# Patient Record
Sex: Female | Born: 1965 | Race: White | Hispanic: No | Marital: Married | Smoking: Never smoker
Health system: Southern US, Community
[De-identification: ages and names within clinical notes are randomized; demographics above are authoritative.]

## PROBLEM LIST (undated history)

## (undated) DIAGNOSIS — D649 Anemia, unspecified: Secondary | ICD-10-CM

## (undated) HISTORY — PX: DILATION AND CURETTAGE OF UTERUS: SHX78

---

## 1999-11-01 ENCOUNTER — Other Ambulatory Visit: Admission: RE | Admit: 1999-11-01 | Discharge: 1999-11-01 | Payer: Self-pay | Admitting: Obstetrics and Gynecology

## 2006-05-03 ENCOUNTER — Ambulatory Visit (HOSPITAL_COMMUNITY): Admission: RE | Admit: 2006-05-03 | Discharge: 2006-05-03 | Payer: Self-pay | Admitting: Obstetrics and Gynecology

## 2011-07-25 ENCOUNTER — Encounter (HOSPITAL_COMMUNITY)
Admission: RE | Admit: 2011-07-25 | Discharge: 2011-07-25 | Disposition: A | Discharge status: Home / Self Care | Payer: 59 | Source: Ambulatory Visit | Attending: Obstetrics and Gynecology | Admitting: Obstetrics and Gynecology

## 2011-07-25 ENCOUNTER — Encounter (HOSPITAL_COMMUNITY): Payer: Self-pay | Admitting: Pharmacist

## 2011-07-25 ENCOUNTER — Other Ambulatory Visit: Payer: Self-pay | Admitting: Obstetrics and Gynecology

## 2011-07-25 ENCOUNTER — Encounter (HOSPITAL_COMMUNITY): Payer: Self-pay

## 2011-07-25 HISTORY — DX: Anemia, unspecified: D64.9

## 2011-07-25 LAB — CBC
HCT: 37.6 % (ref 36.0–46.0)
Hemoglobin: 12.4 g/dL (ref 12.0–15.0)
RBC: 4.21 MIL/uL (ref 3.87–5.11)
WBC: 6.1 10*3/uL (ref 4.0–10.5)

## 2011-07-25 LAB — SURGICAL PCR SCREEN: Staphylococcus aureus: NEGATIVE

## 2011-07-25 NOTE — H&P (Signed)
Theresa Ramos is an 46 y.o. female with fibroids measuring 1.8 to 5.9 cm on U/S . Difficult to see right ovary on U/S. Patient complains of increasingly heavy menses and discomfort with intercourse.  Pertinent Gynecological History: Menses: flow is excessive with use of 6 pads or tampons on heaviest days Bleeding: dysfunctional uterine bleeding Contraception: vasectomy DES exposure: unknown Blood transfusions: none Sexually transmitted diseases: no past history Previous GYN Procedures: none  Last mammogram: normal Date: 2013 Last pap: normal Date: 2013 OB History: G0, P0   Menstrual History: Menarche age: unknown No LMP recorded.    Past Medical History  Diagnosis Date  . Anemia     Past Surgical History  Procedure Date  . Dilation and curettage of uterus     No family history on file.  Social History:  reports that she has never smoked. She does not have any smokeless tobacco history on file. She reports that she drinks alcohol. She reports that she does not use illicit drugs.  Allergies:  Allergies  Allergen Reactions  . Adhesive (Tape) Rash    No prescriptions prior to admission    Review of Systems  Constitutional: Negative for fever.  Respiratory: Negative for shortness of breath.   Cardiovascular: Negative for chest pain.    There were no vitals taken for this visit. Physical Exam  Constitutional: She appears well-developed and well-nourished.  HENT:  Head: Normocephalic and atraumatic.  Neck: Normal range of motion. Neck supple. No thyromegaly present.  Cardiovascular: Normal rate and regular rhythm.   Respiratory: Effort normal and breath sounds normal.  GI: Soft.       Uterus palpable 1 fingerbreadth above pubic symphysis  Genitourinary:       Uterus 14-16 weeks size, fills pelvis Adnexal exam compromised  Neurological: She has normal reflexes.    Results for orders placed during the hospital encounter of 07/25/11 (from the past 24 hour(s))    SURGICAL PCR SCREEN     Status: Normal   Collection Time   07/25/11  3:15 PM      Component Value Range   MRSA, PCR NEGATIVE  NEGATIVE    Staphylococcus aureus NEGATIVE  NEGATIVE   CBC     Status: Abnormal   Collection Time   07/25/11  3:30 PM      Component Value Range   WBC 6.1  4.0 - 10.5 (K/uL)   RBC 4.21  3.87 - 5.11 (MIL/uL)   Hemoglobin 12.4  12.0 - 15.0 (g/dL)   HCT 21.3  08.6 - 57.8 (%)   MCV 89.3  78.0 - 100.0 (fL)   MCH 29.5  26.0 - 34.0 (pg)   MCHC 33.0  30.0 - 36.0 (g/dL)   RDW 46.9  62.9 - 52.8 (%)   Platelets 129 (*) 150 - 400 (K/uL)    No results found.  Assessment/Plan: 46 yo G0P0 with symptomatic uterine fibroids.  Options reviewed.  TAH D/W patient, potential risks reviewed including infection, bleeding/transfusion-HIV/Hep, organ damage, DVT/PE, pneumonia, fistula, dyspareunia, abd/pelvic pain. All questions answered.  Shawndra Clute II,Johnhenry Tippin E 07/25/2011, 5:07 PM

## 2011-07-25 NOTE — Patient Instructions (Addendum)
20 Theresa Ramos  07/25/2011   Your procedure is scheduled on:  08/08/11  Enter through the Main Entrance of [facility] at 6 AM.  Pick up the phone at the desk and dial [phone].   Call this number if you have problems the morning of surgery: [phone]   Remember:   Do not eat food:After Midnight.  Do not drink clear liquids: After Midnight.  Take these medicines the morning of surgery with A SIP OF WATER: NA   Do not wear jewelry, make-up or nail polish.  Do not wear lotions, powders, or perfumes. You may wear deodorant.  Do not shave 48 hours prior to surgery.  Do not bring valuables to the hospital.  Contacts, dentures or bridgework may not be worn into surgery.  Leave suitcase in the car. After surgery it may be brought to your room.  For patients admitted to the hospital, checkout time is 11:00 AM the day of discharge.   Patients discharged the day of surgery will not be allowed to drive home.  Name and phone number of your driver: NA  Special Instructions: CHG Shower Use Special Wash: 1/2 bottle night before surgery and 1/2 bottle morning of surgery.   Please read over the following fact sheets that you were given: MRSA Information

## 2011-08-07 MED ORDER — CEFAZOLIN SODIUM-DEXTROSE 2-3 GM-% IV SOLR
2.0000 g | INTRAVENOUS | Status: AC
  Administered 2011-08-08: 2 g via INTRAVENOUS
  Filled 2011-08-07: qty 50

## 2011-08-08 ENCOUNTER — Inpatient Hospital Stay (HOSPITAL_COMMUNITY)
Admission: RE | Admit: 2011-08-08 | Discharge: 2011-08-10 | DRG: 743 | Disposition: A | Discharge status: Home / Self Care | Payer: 59 | Source: Ambulatory Visit | Attending: Obstetrics and Gynecology | Admitting: Obstetrics and Gynecology

## 2011-08-08 ENCOUNTER — Inpatient Hospital Stay (HOSPITAL_COMMUNITY): Payer: 59 | Admitting: Anesthesiology

## 2011-08-08 ENCOUNTER — Encounter (HOSPITAL_COMMUNITY)
Admission: RE | Disposition: A | Discharge status: Home / Self Care | Payer: Self-pay | Source: Ambulatory Visit | Attending: Obstetrics and Gynecology

## 2011-08-08 ENCOUNTER — Encounter (HOSPITAL_COMMUNITY): Payer: Self-pay

## 2011-08-08 ENCOUNTER — Encounter (HOSPITAL_COMMUNITY): Payer: Self-pay | Admitting: Anesthesiology

## 2011-08-08 DIAGNOSIS — N803 Endometriosis of pelvic peritoneum, unspecified: Secondary | ICD-10-CM | POA: Diagnosis present

## 2011-08-08 DIAGNOSIS — N938 Other specified abnormal uterine and vaginal bleeding: Secondary | ICD-10-CM | POA: Diagnosis present

## 2011-08-08 DIAGNOSIS — N949 Unspecified condition associated with female genital organs and menstrual cycle: Secondary | ICD-10-CM | POA: Diagnosis present

## 2011-08-08 DIAGNOSIS — N840 Polyp of corpus uteri: Secondary | ICD-10-CM | POA: Diagnosis present

## 2011-08-08 DIAGNOSIS — N841 Polyp of cervix uteri: Secondary | ICD-10-CM | POA: Diagnosis present

## 2011-08-08 DIAGNOSIS — D251 Intramural leiomyoma of uterus: Principal | ICD-10-CM | POA: Diagnosis present

## 2011-08-08 HISTORY — PX: ABDOMINAL HYSTERECTOMY: SHX81

## 2011-08-08 LAB — CBC
Hemoglobin: 11.1 g/dL — ABNORMAL LOW (ref 12.0–15.0)
MCHC: 32.6 g/dL (ref 30.0–36.0)
RDW: 13.9 % (ref 11.5–15.5)
WBC: 9.5 10*3/uL (ref 4.0–10.5)

## 2011-08-08 SURGERY — HYSTERECTOMY, ABDOMINAL
Anesthesia: General | Site: Abdomen | Wound class: Clean Contaminated

## 2011-08-08 MED ORDER — KETOROLAC TROMETHAMINE 30 MG/ML IJ SOLN: 15.0000 mg | Freq: Once | INTRAMUSCULAR | Status: DC | PRN

## 2011-08-08 MED ORDER — DIPHENHYDRAMINE HCL 50 MG/ML IJ SOLN: 12.5000 mg | INTRAMUSCULAR | Status: DC | PRN

## 2011-08-08 MED ORDER — ACETAMINOPHEN 325 MG PO TABS: 325.0000 mg | ORAL_TABLET | ORAL | Status: DC | PRN

## 2011-08-08 MED ORDER — ACETAMINOPHEN 10 MG/ML IV SOLN
1000.0000 mg | Freq: Four times a day (QID) | INTRAVENOUS | Status: AC | PRN
  Administered 2011-08-08: 1000 mg via INTRAVENOUS
  Filled 2011-08-08 (×2): qty 100

## 2011-08-08 MED ORDER — ONDANSETRON HCL 4 MG/2ML IJ SOLN
INTRAMUSCULAR | Status: AC
  Filled 2011-08-08: qty 2

## 2011-08-08 MED ORDER — DIPHENHYDRAMINE HCL 50 MG/ML IJ SOLN
INTRAMUSCULAR | Status: DC | PRN
  Administered 2011-08-08: 25 mg via INTRAVENOUS

## 2011-08-08 MED ORDER — NALOXONE HCL 0.4 MG/ML IJ SOLN: 0.4000 mg | INTRAMUSCULAR | Status: DC | PRN

## 2011-08-08 MED ORDER — LACTATED RINGERS IV SOLN
INTRAVENOUS | Status: DC
  Administered 2011-08-08 (×5): via INTRAVENOUS

## 2011-08-08 MED ORDER — MORPHINE SULFATE (PF) 0.5 MG/ML IJ SOLN
INTRAMUSCULAR | Status: DC | PRN
  Administered 2011-08-08: 100 ug via INTRATHECAL

## 2011-08-08 MED ORDER — MEPERIDINE HCL 25 MG/ML IJ SOLN: 6.2500 mg | INTRAMUSCULAR | Status: DC | PRN

## 2011-08-08 MED ORDER — PHENYLEPHRINE 40 MCG/ML (10ML) SYRINGE FOR IV PUSH (FOR BLOOD PRESSURE SUPPORT)
PREFILLED_SYRINGE | INTRAVENOUS | Status: AC
  Filled 2011-08-08: qty 10

## 2011-08-08 MED ORDER — ONDANSETRON HCL 4 MG/2ML IJ SOLN: 4.0000 mg | Freq: Three times a day (TID) | INTRAMUSCULAR | Status: DC | PRN

## 2011-08-08 MED ORDER — ONDANSETRON HCL 4 MG/2ML IJ SOLN
INTRAMUSCULAR | Status: DC | PRN
  Administered 2011-08-08 (×2): 4 mg via INTRAVENOUS

## 2011-08-08 MED ORDER — SODIUM CHLORIDE 0.9 % IJ SOLN: 3.0000 mL | INTRAMUSCULAR | Status: DC | PRN

## 2011-08-08 MED ORDER — PROPOFOL 10 MG/ML IV EMUL
INTRAVENOUS | Status: DC | PRN
  Administered 2011-08-08 (×5): 2 mL via INTRAVENOUS

## 2011-08-08 MED ORDER — FENTANYL CITRATE 0.05 MG/ML IJ SOLN
INTRAMUSCULAR | Status: AC
  Filled 2011-08-08: qty 2

## 2011-08-08 MED ORDER — BUPIVACAINE HCL 0.75 % IJ SOLN
INTRAMUSCULAR | Status: DC | PRN
  Administered 2011-08-08: 2 mL via INTRATHECAL

## 2011-08-08 MED ORDER — PHENYLEPHRINE 40 MCG/ML (10ML) SYRINGE FOR IV PUSH (FOR BLOOD PRESSURE SUPPORT)
PREFILLED_SYRINGE | INTRAVENOUS | Status: AC
  Filled 2011-08-08: qty 5

## 2011-08-08 MED ORDER — ALUM & MAG HYDROXIDE-SIMETH 200-200-20 MG/5ML PO SUSP: 30.0000 mL | ORAL | Status: DC | PRN

## 2011-08-08 MED ORDER — DIPHENHYDRAMINE HCL 25 MG PO CAPS: 25.0000 mg | ORAL_CAPSULE | ORAL | Status: DC | PRN

## 2011-08-08 MED ORDER — DEXAMETHASONE SODIUM PHOSPHATE 10 MG/ML IJ SOLN
INTRAMUSCULAR | Status: DC | PRN
  Administered 2011-08-08: 10 mg via INTRAVENOUS

## 2011-08-08 MED ORDER — DIPHENHYDRAMINE HCL 25 MG PO CAPS
25.0000 mg | ORAL_CAPSULE | ORAL | Status: DC | PRN
  Filled 2011-08-08: qty 1

## 2011-08-08 MED ORDER — DIPHENHYDRAMINE HCL 50 MG/ML IJ SOLN
INTRAMUSCULAR | Status: AC
  Filled 2011-08-08: qty 1

## 2011-08-08 MED ORDER — ONDANSETRON HCL 4 MG PO TABS: 4.0000 mg | ORAL_TABLET | Freq: Four times a day (QID) | ORAL | Status: DC | PRN

## 2011-08-08 MED ORDER — KETOROLAC TROMETHAMINE 60 MG/2ML IM SOLN
60.0000 mg | Freq: Once | INTRAMUSCULAR | Status: AC | PRN
  Administered 2011-08-08: 60 mg via INTRAMUSCULAR

## 2011-08-08 MED ORDER — KETOROLAC TROMETHAMINE 30 MG/ML IJ SOLN: 30.0000 mg | Freq: Four times a day (QID) | INTRAMUSCULAR | Status: DC | PRN

## 2011-08-08 MED ORDER — SCOPOLAMINE 1 MG/3DAYS TD PT72
MEDICATED_PATCH | TRANSDERMAL | Status: AC
  Administered 2011-08-08: 1.5 mg via TRANSDERMAL
  Filled 2011-08-08: qty 1

## 2011-08-08 MED ORDER — NALBUPHINE HCL 10 MG/ML IJ SOLN: 5.0000 mg | INTRAMUSCULAR | Status: DC | PRN

## 2011-08-08 MED ORDER — DEXAMETHASONE SODIUM PHOSPHATE 10 MG/ML IJ SOLN
INTRAMUSCULAR | Status: AC
  Filled 2011-08-08: qty 1

## 2011-08-08 MED ORDER — NALBUPHINE SYRINGE 5 MG/0.5 ML
5.0000 mg | INJECTION | INTRAMUSCULAR | Status: DC | PRN
  Filled 2011-08-08: qty 1

## 2011-08-08 MED ORDER — FENTANYL CITRATE 0.05 MG/ML IJ SOLN: 25.0000 ug | INTRAMUSCULAR | Status: DC | PRN

## 2011-08-08 MED ORDER — KETOROLAC TROMETHAMINE 30 MG/ML IJ SOLN
30.0000 mg | Freq: Four times a day (QID) | INTRAMUSCULAR | Status: AC | PRN
  Administered 2011-08-08 – 2011-08-09 (×2): 30 mg via INTRAVENOUS
  Filled 2011-08-08 (×2): qty 1

## 2011-08-08 MED ORDER — MENTHOL 3 MG MT LOZG: 1.0000 | LOZENGE | OROMUCOSAL | Status: DC | PRN

## 2011-08-08 MED ORDER — MIDAZOLAM HCL 5 MG/5ML IJ SOLN
INTRAMUSCULAR | Status: DC | PRN
  Administered 2011-08-08: .5 mg via INTRAVENOUS
  Administered 2011-08-08: 1.5 mg via INTRAVENOUS

## 2011-08-08 MED ORDER — IBUPROFEN 600 MG PO TABS
600.0000 mg | ORAL_TABLET | Freq: Four times a day (QID) | ORAL | Status: DC | PRN
  Administered 2011-08-09 – 2011-08-10 (×4): 600 mg via ORAL
  Filled 2011-08-08 (×4): qty 1

## 2011-08-08 MED ORDER — METOCLOPRAMIDE HCL 5 MG/ML IJ SOLN
INTRAMUSCULAR | Status: AC
  Filled 2011-08-08: qty 2

## 2011-08-08 MED ORDER — ONDANSETRON HCL 4 MG/2ML IJ SOLN: 4.0000 mg | Freq: Four times a day (QID) | INTRAMUSCULAR | Status: DC | PRN

## 2011-08-08 MED ORDER — METOCLOPRAMIDE HCL 5 MG/ML IJ SOLN
INTRAMUSCULAR | Status: DC | PRN
  Administered 2011-08-08: 10 mg via INTRAVENOUS

## 2011-08-08 MED ORDER — DIPHENHYDRAMINE HCL 50 MG/ML IJ SOLN: 25.0000 mg | INTRAMUSCULAR | Status: DC | PRN

## 2011-08-08 MED ORDER — MIDAZOLAM HCL 2 MG/2ML IJ SOLN
INTRAMUSCULAR | Status: AC
  Filled 2011-08-08: qty 2

## 2011-08-08 MED ORDER — LIDOCAINE HCL (CARDIAC) 20 MG/ML IV SOLN
INTRAVENOUS | Status: AC
  Filled 2011-08-08: qty 5

## 2011-08-08 MED ORDER — KETOROLAC TROMETHAMINE 60 MG/2ML IM SOLN
INTRAMUSCULAR | Status: AC
  Administered 2011-08-08: 60 mg via INTRAMUSCULAR
  Filled 2011-08-08: qty 2

## 2011-08-08 MED ORDER — SODIUM CHLORIDE 0.9 % IV SOLN
1.0000 ug/kg/h | INTRAVENOUS | Status: DC | PRN
  Filled 2011-08-08: qty 2.5

## 2011-08-08 MED ORDER — SCOPOLAMINE 1 MG/3DAYS TD PT72
1.0000 | MEDICATED_PATCH | Freq: Once | TRANSDERMAL | Status: DC
  Administered 2011-08-08: 1.5 mg via TRANSDERMAL

## 2011-08-08 MED ORDER — PROMETHAZINE HCL 25 MG/ML IJ SOLN: 6.2500 mg | INTRAMUSCULAR | Status: DC | PRN

## 2011-08-08 MED ORDER — KETOROLAC TROMETHAMINE 30 MG/ML IJ SOLN: 30.0000 mg | Freq: Four times a day (QID) | INTRAMUSCULAR | Status: AC | PRN

## 2011-08-08 MED ORDER — DOCUSATE SODIUM 100 MG PO CAPS
100.0000 mg | ORAL_CAPSULE | Freq: Two times a day (BID) | ORAL | Status: DC
  Administered 2011-08-08 – 2011-08-10 (×4): 100 mg via ORAL
  Filled 2011-08-08 (×4): qty 1

## 2011-08-08 MED ORDER — ZOLPIDEM TARTRATE 5 MG PO TABS: 5.0000 mg | ORAL_TABLET | Freq: Every evening | ORAL | Status: DC | PRN

## 2011-08-08 MED ORDER — SCOPOLAMINE 1 MG/3DAYS TD PT72: 1.0000 | MEDICATED_PATCH | Freq: Once | TRANSDERMAL | Status: DC

## 2011-08-08 MED ORDER — MIDAZOLAM HCL 2 MG/2ML IJ SOLN: 0.5000 mg | Freq: Once | INTRAMUSCULAR | Status: DC | PRN

## 2011-08-08 MED ORDER — METOCLOPRAMIDE HCL 5 MG/ML IJ SOLN: 10.0000 mg | Freq: Three times a day (TID) | INTRAMUSCULAR | Status: DC | PRN

## 2011-08-08 MED ORDER — FENTANYL CITRATE 0.05 MG/ML IJ SOLN
INTRAMUSCULAR | Status: DC | PRN
  Administered 2011-08-08: 25 ug via INTRATHECAL
  Administered 2011-08-08: 75 ug via INTRAVENOUS
  Administered 2011-08-08: 25 ug via INTRAVENOUS

## 2011-08-08 MED ORDER — PROPOFOL 10 MG/ML IV EMUL
INTRAVENOUS | Status: AC
  Filled 2011-08-08: qty 20

## 2011-08-08 MED ORDER — HYDROMORPHONE HCL PF 1 MG/ML IJ SOLN: 0.2000 mg | INTRAMUSCULAR | Status: DC | PRN

## 2011-08-08 MED ORDER — OXYCODONE-ACETAMINOPHEN 5-325 MG PO TABS
1.0000 | ORAL_TABLET | ORAL | Status: DC | PRN
  Administered 2011-08-08 – 2011-08-09 (×3): 1 via ORAL
  Administered 2011-08-09 (×3): 2 via ORAL
  Administered 2011-08-10: 1 via ORAL
  Administered 2011-08-10 (×2): 2 via ORAL
  Filled 2011-08-08 (×3): qty 1
  Filled 2011-08-08 (×3): qty 2
  Filled 2011-08-08: qty 1
  Filled 2011-08-08 (×2): qty 2

## 2011-08-08 MED ORDER — LACTATED RINGERS IV SOLN
INTRAVENOUS | Status: DC
  Administered 2011-08-08 – 2011-08-09 (×2): via INTRAVENOUS

## 2011-08-08 MED ORDER — PHENYLEPHRINE HCL 10 MG/ML IJ SOLN
INTRAMUSCULAR | Status: DC | PRN
  Administered 2011-08-08: 60 ug via INTRAVENOUS
  Administered 2011-08-08 (×2): 80 ug via INTRAVENOUS
  Administered 2011-08-08: 40 ug via INTRAVENOUS
  Administered 2011-08-08 (×4): 80 ug via INTRAVENOUS
  Administered 2011-08-08: 40 ug via INTRAVENOUS
  Administered 2011-08-08 (×2): 80 ug via INTRAVENOUS

## 2011-08-08 MED ORDER — SODIUM CHLORIDE 0.9 % IV SOLN: 1.0000 ug/kg/h | INTRAVENOUS | Status: DC | PRN

## 2011-08-08 MED ORDER — MORPHINE SULFATE 0.5 MG/ML IJ SOLN
INTRAMUSCULAR | Status: AC
  Filled 2011-08-08: qty 10

## 2011-08-08 SURGICAL SUPPLY — 32 items
CANISTER SUCTION 2500CC (MISCELLANEOUS) ×2 IMPLANT
CLOTH BEACON ORANGE TIMEOUT ST (SAFETY) ×2 IMPLANT
CONT PATH 16OZ SNAP LID 3702 (MISCELLANEOUS) ×2 IMPLANT
DECANTER SPIKE VIAL GLASS SM (MISCELLANEOUS) IMPLANT
DERMABOND ADVANCED (GAUZE/BANDAGES/DRESSINGS) ×1
DERMABOND ADVANCED .7 DNX12 (GAUZE/BANDAGES/DRESSINGS) ×1 IMPLANT
DRSG COVADERM 4X10 (GAUZE/BANDAGES/DRESSINGS) ×2 IMPLANT
ELECT LIGASURE SHORT 9 REUSE (ELECTRODE) ×2 IMPLANT
GLOVE BIO SURGEON STRL SZ8 (GLOVE) ×4 IMPLANT
GOWN PREVENTION PLUS LG XLONG (DISPOSABLE) ×4 IMPLANT
NS IRRIG 1000ML POUR BTL (IV SOLUTION) ×2 IMPLANT
PACK ABDOMINAL GYN (CUSTOM PROCEDURE TRAY) ×2 IMPLANT
PAD ABD 7.5X8 STRL (GAUZE/BANDAGES/DRESSINGS) ×2 IMPLANT
PAD OB MATERNITY 4.3X12.25 (PERSONAL CARE ITEMS) ×2 IMPLANT
PROTECTOR NERVE ULNAR (MISCELLANEOUS) ×2 IMPLANT
SPONGE LAP 18X18 X RAY DECT (DISPOSABLE) ×4 IMPLANT
STAPLER VISISTAT 35W (STAPLE) ×2 IMPLANT
STRIP CLOSURE SKIN 1/2X4 (GAUZE/BANDAGES/DRESSINGS) ×2 IMPLANT
STRIP CLOSURE SKIN 1/4X4 (GAUZE/BANDAGES/DRESSINGS) ×2 IMPLANT
SUT MNCRL 0 MO-4 VIOLET 18 CR (SUTURE) ×3 IMPLANT
SUT MNCRL 0 VIOLET 6X18 (SUTURE) ×1 IMPLANT
SUT MNCRL AB 0 CT1 27 (SUTURE) ×2 IMPLANT
SUT MON AB-0 CT1 36 (SUTURE) ×2 IMPLANT
SUT MONOCRYL 0 6X18 (SUTURE) ×1
SUT MONOCRYL 0 MO 4 18  CR/8 (SUTURE) ×3
SUT PDS AB 0 CTX 60 (SUTURE) ×4 IMPLANT
SUT PLAIN 2 0 XLH (SUTURE) IMPLANT
SYR CONTROL 10ML LL (SYRINGE) IMPLANT
TAPE CLOTH SURG 4X10 WHT LF (GAUZE/BANDAGES/DRESSINGS) ×2 IMPLANT
TOWEL OR 17X24 6PK STRL BLUE (TOWEL DISPOSABLE) ×4 IMPLANT
TRAY FOLEY CATH 14FR (SET/KITS/TRAYS/PACK) ×2 IMPLANT
WATER STERILE IRR 1000ML POUR (IV SOLUTION) IMPLANT

## 2011-08-08 NOTE — OR Nursing (Signed)
Patients abdomen reddened 1 1/2 inches around incision noted when drapes removed

## 2011-08-08 NOTE — Anesthesia Postprocedure Evaluation (Signed)
  Anesthesia Post-op Note  Patient: Theresa Ramos  Procedure(s) Performed: Procedure(s) (LRB): HYSTERECTOMY ABDOMINAL (N/A)  Patient Location: PACU  Anesthesia Type: Spinal  Level of Consciousness: awake, alert  and oriented  Airway and Oxygen Therapy: Patient Spontanous Breathing  Post-op Pain: none  Post-op Assessment: Post-op Vital signs reviewed, Patient's Cardiovascular Status Stable, Respiratory Function Stable, Patent Airway, No signs of Nausea or vomiting, Pain level controlled, No headache and No backache  Post-op Vital Signs: Reviewed and stable  Complications: No apparent anesthesia complications

## 2011-08-08 NOTE — Progress Notes (Signed)
No changes to H&P per patient history Reviewed with patient proceedure

## 2011-08-08 NOTE — Brief Op Note (Signed)
08/08/2011  9:04 AM  PATIENT:  Theresa Ramos  46 y.o. female  PRE-OPERATIVE DIAGNOSIS:  fibroids  POST-OPERATIVE DIAGNOSIS:  fibroids, endometriosis, endocervical polyp  PROCEDURE:  Procedure(s) (LRB):Supracervical HYSTERECTOMY ABDOMINAL (N/A)  SURGEON:  Surgeon(s) and Role:    * Leslie Andrea, MD - Primary  PHYSICIAN ASSISTANT:   ASSISTANTS: Richarda Overlie MD   ANESTHESIA:   spinal  EBL:  Total I/O In: 2000 [I.V.:2000] Out: 950 [Urine:600; Blood:350]  BLOOD ADMINISTERED:none  DRAINS: Urinary Catheter (Foley)   LOCAL MEDICATIONS USED:  NONE  SPECIMEN:  Source of Specimen:  uterus, endocervical polyp  DISPOSITION OF SPECIMEN:  PATHOLOGY  COUNTS:  YES  TOURNIQUET:  * No tourniquets in log *  DICTATION: .Other Dictation: Dictation Number [id number]  PLAN OF CARE: Admit to inpatient   PATIENT DISPOSITION:  PACU - hemodynamically stable.   Delay start of Pharmacological VTE agent (>24hrs) due to surgical blood loss or risk of bleeding: not applicable

## 2011-08-08 NOTE — Anesthesia Procedure Notes (Signed)
Spinal  Patient location during procedure: OR Start time: 08/08/2011 7:32 AM Staffing Anesthesiologist: Brayton Caves R Performed by: anesthesiologist  Preanesthetic Checklist Completed: patient identified, site marked, surgical consent, pre-op evaluation, timeout performed, IV checked, risks and benefits discussed and monitors and equipment checked Spinal Block Patient position: sitting Prep: DuraPrep Patient monitoring: heart rate, cardiac monitor, continuous pulse ox and blood pressure Approach: midline Location: L3-4 Injection technique: single-shot Needle Needle type: Sprotte  Needle gauge: 24 G Needle length: 9 cm Assessment Sensory level: T4 Additional Notes Patient identified.  Risk benefits discussed including failed block, incomplete pain control, headache, nerve damage, paralysis, blood pressure changes, nausea, vomiting, reactions to medication both toxic or allergic, and postpartum back pain.  Patient expressed understanding and wished to proceed.  All questions were answered.  Sterile technique used throughout procedure.  CSF was clear.  No parasthesia or other complications.  Please see nursing notes for vital signs.

## 2011-08-08 NOTE — Op Note (Signed)
Theresa Ramos, ARCHIBALD               ACCOUNT NO.:  [id number]  MEDICAL RECORD NO.:  [id number]  LOCATION:  [facility]                          FACILITY:  [facility]  PHYSICIAN:  Guy Sandifer. Henderson Cloud, M.D. DATE OF BIRTH:  04/10/1965  DATE OF PROCEDURE:  08/08/2011 DATE OF DISCHARGE:                              OPERATIVE REPORT   PREOPERATIVE DIAGNOSIS:  Uterine leiomyomata.  POSTOPERATIVE DIAGNOSES: 1. Uterine leiomyomata. 2. Endometriosis.  PROCEDURE:  Abdominal supracervical hysterectomy.  SPECIMENS: 1. Uterus. 2. Endocervical polyp, both to Pathology.  SURGEON:  Guy Sandifer. Henderson Cloud, MD.  ASSISTANTDuke Salvia. Marcelle Overlie, MD.  ANESTHESIA:  Spinal.  ANESTHESIOLOGIST:  Germaine Pomfret, MD.  ESTIMATED BLOOD LOSS:  350 mL.  INDICATIONS AND CONSENT: This patient is a 46 year old married white female, husband status post vasectomy with increasingly symptomatic uterine leiomyomata with heavy painful menses.  After discussion of options, total abdominal hysterectomy is elected by the patient.  The ovaries will be retained unless one or both if abnormal.  Potential risks and complications were reviewed preoperatively including but not limited to, infection, organ damage, bleeding requiring transfusion of blood products with HIV and hepatitis acquisition, DVT, PE, pneumonia, fistula formation, postoperative pelvic pain, painful intercourse.  All questions have been answered and consent is signed on the chart.  FINDINGS:  The uterus is about 16 weeks in size with multiple leiomyomata.  There was at least one 5-6 cm intramural leiomyoma in the upper posterior fundus.  There is a 5-cm pedunculated leiomyoma from the right mid uterus extending into the broad ligament.  There is a 2-3 cm palpable nodule consistent with endometriosis from the rectum in the posterior cul-de-sac that is adherent to the vagina.  It appears to be transmural on palpation and there is some induration extending on  either side in the rectum as well.  There are some small implants of endometriosis on the vesicouterine peritoneum.  The ovaries appear normal.  PROCEDURE IN DETAIL:  The patient was taken to the operating room where she was identified, spinal anesthetic was placed, and she was placed in a dorsal supine position.  She was prepped abdominally and vaginally. Foley catheter was placed in the bladder and she was draped in a sterile fashion.  Time-out was undertaken.  Skin was entered through a Pfannenstiel incision and dissection was carried out in layers to the peritoneum, which was perforated and extended superiorly and inferiorly. Andrey Spearman retractor was placed.  Bladder blade was placed.  The bowel was packed away and the upper blade was placed as well.  Proximal ligaments were grasped bilaterally with large Kelly clamps.  The left round ligament was taken down with LigaSure handheld bipolar cautery. The anterior leaf of the broad ligament was then taken down sharply, carried down to the vesicouterine peritoneum, and the posterior leaf was bluntly perforated.  Proximal ligaments were taken down in a stepwise fashion and bites with the LigaSure cautery.  The uterine vessels on the left side were then further skeletonized with progressive bites and taken down with the LigaSure as well.  The round ligament on the right side was taken down in a similar fashion as well as the anterior leaf of  the broad ligament.  This allowed the proximal ligaments to be taken down in stepwise fashion, working around the superior aspect of the pedunculated fibroid extending into the broad ligament on that side. This allowed the broad ligaments and the infundibulopelvic ligament and the ovary to easily fall away and move laterally without difficulty. After the uterine vessels were controlled bilaterally using the malleable clamps as a backstop, the uterine fundus was amputated with a scalpel to obtain  better visualization.  This also allowed the pedunculated fibroid on the right side to be taken separately under good visualization.  Again, well clear of adnexal structures.  Bleeders were controlled with the LigaSure.  This then allowed the bladder to be further advanced in the customary fashion.  Straight clamps were used to take down the cardinal ligaments bilaterally to also take down the uterosacral ligaments.  These pedicles were controlled with 0 Monocryl sutures.  At this point, the nodule in the rectum adherent to the posterior cul-de-sac was noted to be high enough that it interferes with completely removing the cervix.  Therefore, the decision was made to perform a supracervical hysterectomy and to leave part of the cervix behind.  The remaining exposed portion of the lower uterine segment and the upper cervix was resected under good visualization with a scalpel. Prior to this, an endocervical polyp was noted.  After the initial amputation of the uterine fundus, which was removed as well.  After the upper part of the cervix had been amputated, the endocervical canal was cauterized and the stump was closed in the anterior posterior fashion with interrupted 0 Monocryl sutures, which achieved good hemostasis. Copious irrigation reveals good hemostasis all around.  The ovaries were plicated to the round ligaments bilaterally with 0 Monocryl.  After again noting good hemostasis, instruments were removed.  Anterior peritoneum was closed in running fashion with 0 Monocryl suture, which was also used to reapproximate the pyramidalis muscle in the midline. The anterior rectus fascia was closed in a running fashion with a 0 looped PDS.  Subcutaneous tissues were reapproximated with interrupted plain suture and the skin was closed with a subcuticular 4-0 Vicryl suture on a Keith needle.  Dermabond was applied.  All counts were correct.  The patient was taken to the recovery room in  stable condition.     Guy Sandifer Henderson Cloud, M.D.     JET/MEDQ  D:  08/08/2011  T:  08/08/2011  Job:  [id number]

## 2011-08-08 NOTE — Anesthesia Preprocedure Evaluation (Addendum)
Anesthesia Evaluation  Patient identified by MRN, date of birth, ID band Patient awake    Reviewed: Allergy & Precautions, H&P , Patient's Chart, lab work & pertinent test results, reviewed documented beta blocker date and time   History of Anesthesia Complications Negative for: history of anesthetic complications  Airway Mallampati: II TM Distance: >3 FB Neck ROM: full    Dental No notable dental hx.    Pulmonary neg pulmonary ROS,  breath sounds clear to auscultation  Pulmonary exam normal       Cardiovascular Exercise Tolerance: Good negative cardio ROS  Rhythm:regular Rate:Normal     Neuro/Psych negative neurological ROS  negative psych ROS   GI/Hepatic negative GI ROS, Neg liver ROS,   Endo/Other  negative endocrine ROS  Renal/GU negative Renal ROS     Musculoskeletal   Abdominal   Peds  Hematology negative hematology ROS (+)   Anesthesia Other Findings   Reproductive/Obstetrics negative OB ROS                           Anesthesia Physical Anesthesia Plan  ASA: I  Anesthesia Plan: Spinal   Post-op Pain Management:    Induction:   Airway Management Planned:   Additional Equipment:   Intra-op Plan:   Post-operative Plan:   Informed Consent: I have reviewed the patients History and Physical, chart, labs and discussed the procedure including the risks, benefits and alternatives for the proposed anesthesia with the patient or authorized representative who has indicated his/her understanding and acceptance.   Dental Advisory Given  Plan Discussed with: CRNA and Surgeon  Anesthesia Plan Comments:       Anesthesia Quick Evaluation

## 2011-08-08 NOTE — Progress Notes (Signed)
Feeling good, ambulated to door, now in chair Took po pain med with good relief, no flatus yet  Blood pressure 100/61, pulse 72, temperature 98.2 F (36.8 C), temperature source Oral, resp. rate 16, height 5\' 3"  (1.6 m), weight 73.483 kg (162 lb), SpO2 95.00%.  Lungs CTA Cor RRR Abd soft, some BS UO clear  A: stable P: per orders

## 2011-08-08 NOTE — Addendum Note (Signed)
Addendum  created 08/08/11 1647 by Algis Greenhouse, CRNA   Modules edited:Notes Section

## 2011-08-08 NOTE — Anesthesia Postprocedure Evaluation (Signed)
  Anesthesia Post-op Note  Patient: Theresa Ramos  Procedure(s) Performed: Procedure(s) (LRB): HYSTERECTOMY ABDOMINAL (N/A)  Patient Location: [facility]  Anesthesia Type: Spinal  Level of Consciousness: awake  Airway and Oxygen Therapy: Patient Spontanous Breathing  Post-op Pain: mild  Post-op Assessment: Post-op Vital signs reviewed  Post-op Vital Signs: Reviewed and stable  Complications: No apparent anesthesia complications

## 2011-08-08 NOTE — Transfer of Care (Signed)
Immediate Anesthesia Transfer of Care Note  Patient: Theresa Ramos  Procedure(s) Performed: Procedure(s) (LRB): HYSTERECTOMY ABDOMINAL (N/A)  Patient Location: PACU  Anesthesia Type: Spinal  Level of Consciousness: awake  Airway & Oxygen Therapy: Patient Spontanous Breathing  Post-op Assessment: Report given to PACU RN  Post vital signs: Reviewed and stable  Complications: No apparent anesthesia complications

## 2011-08-09 ENCOUNTER — Encounter (HOSPITAL_COMMUNITY): Payer: Self-pay | Admitting: Obstetrics and Gynecology

## 2011-08-09 NOTE — Progress Notes (Signed)
UR Chart review completed.  

## 2011-08-09 NOTE — Progress Notes (Signed)
POD #1 Passing flatus, voiding, ambulating No N/V More low abd soreness today-requests abdominal binder  Blood pressure 90/51, pulse 75, temperature 98 F (36.7 C), temperature source Oral, resp. rate 16, height 5\' 3"  (1.6 m), weight 73.483 kg (162 lb), SpO2 100.00%.  Abd soft, good BS, incision OK  Results for orders placed during the hospital encounter of 08/08/11 (from the past 24 hour(s))  CBC     Status: Abnormal   Collection Time   08/08/11  2:40 PM      Component Value Range   WBC 9.5  4.0 - 10.5 (K/uL)   RBC 3.83 (*) 3.87 - 5.11 (MIL/uL)   Hemoglobin 11.1 (*) 12.0 - 15.0 (g/dL)   HCT 16.1 (*) 09.6 - 46.0 (%)   MCV 89.0  78.0 - 100.0 (fL)   MCH 29.0  26.0 - 34.0 (pg)   MCHC 32.6  30.0 - 36.0 (g/dL)   RDW 04.5  40.9 - 81.1 (%)   Platelets 132 (*) 150 - 400 (K/uL)  A: satisfactory P: abdominal binder Probable discharge tomorrow

## 2011-08-10 MED ORDER — IBUPROFEN 600 MG PO TABS: 600.0000 mg | ORAL_TABLET | Freq: Four times a day (QID) | ORAL | Status: AC | PRN

## 2011-08-10 MED ORDER — OXYCODONE-ACETAMINOPHEN 5-325 MG PO TABS: 1.0000 | ORAL_TABLET | Freq: Four times a day (QID) | ORAL | Status: AC | PRN

## 2011-08-10 NOTE — Progress Notes (Signed)
2 Days Post-Op Procedure(s) (LRB): HYSTERECTOMY ABDOMINAL (N/A)  Subjective: Patient reports tolerating PO, + flatus and no problems voiding.    Objective: I have reviewed patient's vital signs and labs.  General: alert, cooperative and no distress Resp: clear to auscultation bilaterally GI: soft, non-tender; bowel sounds normal; no masses,  no organomegaly and incision: clean and dry Ext non tender  Assessment: s/p Procedure(s) (LRB): HYSTERECTOMY ABDOMINAL (N/A): stable, progressing well and tolerating diet  Plan: Discharge home  LOS: 2 days    Theresa Ramos,Theresa Ramos 08/10/2011, 9:04 AM

## 2011-08-12 NOTE — Discharge Summary (Signed)
Physician Discharge Summary  Patient ID: Theresa Ramos MRN: [id number] DOB/AGE: 44-19-1967 44 y.o.  Admit date: 08/08/2011 Discharge date: 08/12/2011  Admission Diagnoses:Uterine Fibroids   Discharge Diagnoses: Uterine Fibroids Active Problems:  * No active hospital problems. *    Discharged Condition: good  Hospital Course: Postoperatively had good resumption of bowel function. She is voiding well, ambulating, passing flatus with good pain control.  Consults: None  Significant Diagnostic Studies: labs:   Treatments: IV hydration, analgesia: acetaminophen w/ codeine and Dilaudid and surgery: Supracervical abdominal hysterectomy  Discharge Exam: Blood pressure 91/52, pulse 82, temperature 98.6 F (37 C), temperature source Oral, resp. rate 17, height 5\' 3"  (1.6 m), weight 73.483 kg (162 lb), SpO2 97.00%. General appearance: alert, cooperative and no distress Resp: clear to auscultation bilaterally GI: soft, non-tender; bowel sounds normal; no masses,  no organomegaly Incision/Wound:healing well  Disposition: 01-Home or Self Care   Medication List  As of 08/12/2011  8:37 AM   STOP taking these medications         ferrous sulfate 325 (65 FE) MG tablet      naproxen sodium 220 MG tablet      OVER THE COUNTER MEDICATION         TAKE these medications         ibuprofen 600 MG tablet   Commonly known as: ADVIL,MOTRIN   Take 1 tablet (600 mg total) by mouth every 6 (six) hours as needed.      oxyCODONE-acetaminophen 5-325 MG per tablet   Commonly known as: PERCOCET   Take 1-2 tablets by mouth every 6 (six) hours as needed (moderate to severe pain (when tolerating fluids)).             Signed: Daouda Lonzo II,[place] E 08/12/2011, 8:37 AM

## 2014-06-08 ENCOUNTER — Other Ambulatory Visit: Payer: Self-pay | Admitting: Obstetrics and Gynecology

## 2014-06-09 ENCOUNTER — Other Ambulatory Visit: Payer: Self-pay | Admitting: Obstetrics and Gynecology

## 2014-06-09 DIAGNOSIS — N63 Unspecified lump in unspecified breast: Secondary | ICD-10-CM

## 2014-06-09 LAB — CYTOLOGY - PAP

## 2014-06-14 ENCOUNTER — Other Ambulatory Visit: Payer: Self-pay

## 2014-06-17 ENCOUNTER — Ambulatory Visit
Admission: RE | Admit: 2014-06-17 | Discharge: 2014-06-17 | Disposition: A | Discharge status: Home / Self Care | Payer: 59 | Source: Ambulatory Visit | Attending: Obstetrics and Gynecology | Admitting: Obstetrics and Gynecology

## 2014-06-17 DIAGNOSIS — N63 Unspecified lump in unspecified breast: Secondary | ICD-10-CM

## 2014-06-24 ENCOUNTER — Other Ambulatory Visit: Payer: Self-pay | Admitting: Obstetrics and Gynecology

## 2014-06-24 DIAGNOSIS — N888 Other specified noninflammatory disorders of cervix uteri: Secondary | ICD-10-CM

## 2014-06-24 DIAGNOSIS — Z90711 Acquired absence of uterus with remaining cervical stump: Secondary | ICD-10-CM

## 2014-07-07 ENCOUNTER — Ambulatory Visit
Admission: RE | Admit: 2014-07-07 | Discharge: 2014-07-07 | Disposition: A | Discharge status: Home / Self Care | Payer: 59 | Source: Ambulatory Visit | Attending: Obstetrics and Gynecology | Admitting: Obstetrics and Gynecology

## 2014-07-07 DIAGNOSIS — N888 Other specified noninflammatory disorders of cervix uteri: Secondary | ICD-10-CM

## 2014-07-07 DIAGNOSIS — Z90711 Acquired absence of uterus with remaining cervical stump: Secondary | ICD-10-CM

## 2014-07-07 MED ORDER — GADOBENATE DIMEGLUMINE 529 MG/ML IV SOLN
15.0000 mL | Freq: Once | INTRAVENOUS | Status: AC | PRN
  Administered 2014-07-07: 15 mL via INTRAVENOUS

## 2014-07-19 ENCOUNTER — Ambulatory Visit: Payer: 59 | Admitting: Gynecology

## 2014-07-27 ENCOUNTER — Ambulatory Visit: Payer: 59 | Attending: Gynecologic Oncology | Admitting: Gynecologic Oncology

## 2014-07-27 ENCOUNTER — Encounter: Payer: Self-pay | Admitting: Gynecologic Oncology

## 2014-07-27 VITALS — BP 111/65 | HR 78 | Temp 98.4°F | Resp 18 | Ht 63.0 in | Wt 164.4 lb

## 2014-07-27 DIAGNOSIS — R1900 Intra-abdominal and pelvic swelling, mass and lump, unspecified site: Secondary | ICD-10-CM | POA: Insufficient documentation

## 2014-07-27 DIAGNOSIS — Z800 Family history of malignant neoplasm of digestive organs: Secondary | ICD-10-CM | POA: Insufficient documentation

## 2014-07-27 DIAGNOSIS — Z8379 Family history of other diseases of the digestive system: Secondary | ICD-10-CM | POA: Diagnosis not present

## 2014-07-27 DIAGNOSIS — R935 Abnormal findings on diagnostic imaging of other abdominal regions, including retroperitoneum: Secondary | ICD-10-CM | POA: Diagnosis not present

## 2014-07-27 DIAGNOSIS — N804 Endometriosis of rectovaginal septum and vagina: Secondary | ICD-10-CM | POA: Diagnosis not present

## 2014-07-27 DIAGNOSIS — Z90711 Acquired absence of uterus with remaining cervical stump: Secondary | ICD-10-CM | POA: Diagnosis not present

## 2014-07-27 NOTE — Progress Notes (Signed)
Consult Note: Gyn-Onc  Consult was requested by Dr. Everlene Farrier for the evaluation of Theresa Ramos 49 y.o. female with rectovaginal mass  CC:  Chief Complaint  Patient presents with  . Pelvic Mass    Assessment/Plan:  [name]  is a 49 y.o.  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 An alternative diagnosis might be an endometrioma in the rectovaginal septum causing her options the vagina. I performed a history, physical examination, and personally reviewed the patient's imaging films including the MRI of the pelvis from 07/07/14. Based on my physical examination I have substantial concern for a malignant lesion. We will follow-up the results of today's biopsy of the vaginal polyp. If malignancy is identified she will require a radical trachelectomy, upper vagina vaginectomy, low anterior resection and reanastomosis. If it is benign on pathology, given that is asymptomatic, and given that surgical resection would require such an extensive procedure with high morbidity, I would instead recommend a trial of hormonal suppression with continuous OCPs or Depo-Lupron. She is perimenopausal and age, and endometriosis is likely to be a short-lived phenomenon for her. An alternative etiology for the mass could be a cervical myoma. The appearance on MRI is that the mass is originating from the posterior cervix.  HPI: Theresa Ramos is an extremely pleasant 49 year old gravida 0 who is seen in consultation at the request of Dr. Gaetano Net for rectovaginal mass.  The patient has a history of an exploratory laparotomy and supracervical hysterectomy on 08/08/2011 for menorrhagia. Intraoperative findings were significant for a 16 week fibroid uterus and extensive pelvic endometriosis which was causing a proliferation of the rectovaginal septum. The operative note at that time reported a 2-3 cm palpable nodule consistent with endometriosis from the rectum in the  posterior cul-de-sac. For this reason a supracervical hysterectomy was performed and left the cervical stump in situ. Postoperatively the patient reports having a colonoscopy. I have the report of your colonoscopy from August 2013 and this revealed 4 polyps in the rectum, mild diverticulitis and sigmoid colon, and hemorrhoids. No transmural endometriosis was seen  Postoperatively she continued to have regular cyclical menstrual bleeding. However the menorrhagia have resolved. She denies intermenstrual bleeding. She does report occasional mild postcoital spotting.  She denies pelvic pain or hematochezia. She denies narrowing of stool caliber. She denies tenesmus. She is asymptomatic from this pelvic mass.  She saw Dr. Gaetano Net for her annual gynecologic examination in April 2015. At that time he noted growth in the posterior rectovaginal mass. This prompted him to perform an ultrasound scan on 06/17/2014 which revealed the cervix seen with a 5.1 x 2.5 x 3.4 cm mass that appeared to be originating from the posterior cervix. This mass had a cystic hypoechoic center. No blood flow was seen within the hypoechoic portion. However there is a small metabolic flow seen within the solid area around the hypoechoic portion. No adnexal masses or free fluid was seen.  An MRI of the pelvis was then performed on 07/07/2014. This confirmed normal ovaries. But in the pelvic cul-de-sac between the cervix and the rectum there is an ill-defined mass which shows relative T2 hypointensity, internal cystic foci, diffuse contrast enhancement. The mass measured 4.5 x 3.6 x 3.3 cm. It has characteristics consistent with endometriosis.  She is no history of abnormal Pap smears. Her Pap in April 2016 was negative for dysplasia or malignancy. She does have a family history significant for GI malignancy. Her grandmother had a diagnosis of colon  cancer from which she died, and her father had a history of diverticulitis.  Current Meds:   Outpatient Encounter Prescriptions as of 07/27/2014  Medication Sig  . ibuprofen (ADVIL,MOTRIN) 200 MG tablet Take 200 mg by mouth every 6 (six) hours as needed.   No facility-administered encounter medications on file as of 07/27/2014.    Allergy:  Allergies  Allergen Reactions  . Adhesive [Tape] Rash    Social Hx:   History   Social History  . Marital Status: Married    Spouse Name: N/A  . Number of Children: N/A  . Years of Education: N/A   Occupational History  . Not on file.   Social History Main Topics  . Smoking status: Never Smoker   . Smokeless tobacco: Not on file  . Alcohol Use: Yes     Comment: occasional  . Drug Use: No  . Sexual Activity: Not on file   Other Topics Concern  . Not on file   Social History Narrative    Past Surgical Hx:  Past Surgical History  Procedure Laterality Date  . Dilation and curettage of uterus    . Abdominal hysterectomy  08/08/2011    Procedure: HYSTERECTOMY ABDOMINAL;  Surgeon: Allena Katz, MD;  Location: [facility];  Service: Gynecology;  Laterality: N/A;  Supercervical Abdominal Hysterectomy    Past Medical Hx:  Past Medical History  Diagnosis Date  . Anemia     Past Gynecological History:  G0. No history of abnormal paps.  No LMP recorded.  Family Hx: History reviewed. No pertinent family history.  Review of Systems:  Constitutional  Feels well,    ENT Normal appearing ears and nares bilaterally Skin/Breast  No rash, sores, jaundice, itching, dryness Cardiovascular  No chest pain, shortness of breath, or edema  Pulmonary  No cough or wheeze.  Gastro Intestinal  No nausea, vomitting, or diarrhoea. No bright red blood per rectum, no abdominal pain, change in bowel movement, or constipation.  Genito Urinary  No frequency, urgency, dysuria,  Musculo Skeletal  No myalgia, arthralgia, joint swelling or pain  Neurologic  No weakness, numbness, change in gait,  Psychology  No depression, anxiety,  insomnia.   Vitals:  Blood pressure 111/65, pulse 78, temperature 98.4 F (36.9 C), temperature source Oral, resp. rate 18, height 5\' 3"  (1.6 m), weight 164 lb 6.4 oz (74.571 kg), SpO2 100 %.  Physical Exam: WD in NAD Neck  Supple NROM, without any enlargements.  Lymph Node Survey No cervical supraclavicular or inguinal adenopathy Cardiovascular  Pulse normal rate, regularity and rhythm. S1 and S2 normal.  Lungs  Clear to auscultation bilateraly, without wheezes/crackles/rhonchi. Good air movement.  Skin  No rash/lesions/breakdown  Psychiatry  Alert and oriented to person, place, and time  Abdomen  Normoactive bowel sounds, abdomen soft, non-tender and mildly overweight without evidence of hernia.  Back No CVA tenderness Genito Urinary  Vulva/vagina: Normal external female genitalia.  No lesions. No discharge or bleeding.  Bladder/urethra:  No lesions or masses, well supported bladder  Vagina: Friable polypoid tissue erupting from the posterior vaginal fornix separate from the cervix which is otherwise grossly normal in appearance.  Cervix: Normal appearing, no lesions.  Uterus: surgicall absent but in its place is a firm, minimally mobile 5-6cm mass, central, not extending to side walls, intimately involving the rectum posteriorally.  Adnexa: no discrete adnexal masses. Rectal  Good tone, there is a 5-6 cm rectovaginal septal mass that is densely adherent to the rectum though did  not palpably transmural. Extremities  No bilateral cyanosis, clubbing or edema.  PROCEDURE: Vaginal biopsy: Verbal consent was obtained from the patient. A couple can forcep and ring forceps were used to grasp friable tissue from the vaginal fornix. Pressure was applied with a colpostat to achieve hemostasis. Tissue was sent to pathology. The patient tolerated procedure well.   Donaciano Eva, MD   07/27/2014, 2:34 PM

## 2014-07-27 NOTE — Addendum Note (Signed)
Addended by: Thereasa Solo on: 07/27/2014 02:57 PM   Modules accepted: Orders

## 2014-07-27 NOTE — Patient Instructions (Signed)
We will call you as soon as your biopsy results are available. We will discuss your next appointments at that time. Please call us if you have any questions or concerns.

## 2014-07-29 ENCOUNTER — Telehealth: Payer: Self-pay | Admitting: Gynecologic Oncology

## 2014-07-29 ENCOUNTER — Other Ambulatory Visit: Payer: Self-pay | Admitting: Gynecologic Oncology

## 2014-07-29 DIAGNOSIS — N8042 Endometriosis of rectovaginal septum with involvement of vagina: Secondary | ICD-10-CM

## 2014-07-29 DIAGNOSIS — N804 Endometriosis of rectovaginal septum and vagina: Secondary | ICD-10-CM

## 2014-07-29 MED ORDER — NORGESTREL-ETHINYL ESTRADIOL 0.3-30 MG-MCG PO TABS: 1.0000 | ORAL_TABLET | Freq: Every day | ORAL | Status: DC

## 2014-07-29 NOTE — Telephone Encounter (Signed)
Attempted to call patient with biopsy results and Dr. Serita Grit recommendations.  Message left for patient to call the office.

## 2014-07-29 NOTE — Progress Notes (Signed)
Patient informed of pathology along with Dr. Serita Grit plan and recommendations.  Agreeable with trying BCPs.  Questions and concerns answered.  We will contact her with Dr. Serita Grit recommendations for follow up.  Advised to call for any questions or concerns.

## 2014-07-30 ENCOUNTER — Telehealth: Payer: Self-pay | Admitting: *Deleted

## 2014-07-30 NOTE — Telephone Encounter (Signed)
Left message on Pt's voicemail with appointment details. Pt has an appointment scheduled on 09/10/2014 @ 12:00 with Dr. Denman George in [place].

## 2014-09-07 ENCOUNTER — Telehealth: Payer: Self-pay | Admitting: Nurse Practitioner

## 2014-09-07 NOTE — Telephone Encounter (Signed)
Patient calling to cancel her apt on 09/10/14 with Dr. Denman George. She will call tomorrow 09/08/14 to reschedule when she has work schedule in front of her. Apt cancelled as requested.

## 2014-09-10 ENCOUNTER — Ambulatory Visit: Payer: 59 | Admitting: Gynecologic Oncology

## 2014-09-24 ENCOUNTER — Encounter: Payer: Self-pay | Admitting: Gynecologic Oncology

## 2014-09-24 ENCOUNTER — Ambulatory Visit: Payer: 59 | Attending: Gynecologic Oncology | Admitting: Gynecologic Oncology

## 2014-09-24 VITALS — BP 121/71 | HR 70 | Temp 98.9°F | Resp 18 | Ht 63.0 in | Wt 168.4 lb

## 2014-09-24 DIAGNOSIS — R1900 Intra-abdominal and pelvic swelling, mass and lump, unspecified site: Secondary | ICD-10-CM | POA: Diagnosis present

## 2014-09-24 DIAGNOSIS — N804 Endometriosis of rectovaginal septum, unspecified involvement of vagina: Secondary | ICD-10-CM

## 2014-09-24 MED ORDER — LEUPROLIDE ACETATE (3 MONTH) 11.25 MG IM KIT: 11.2500 mg | PACK | INTRAMUSCULAR | Status: DC

## 2014-09-24 NOTE — Progress Notes (Signed)
Followup Note: Gyn-Onc  Consult was initially requested by Dr. Everlene Farrier for the evaluation of Theresa Ramos 49 y.o. female with rectovaginal endometrioma  CC:  Chief Complaint  Patient presents with  . pelvic mass in female    follow-up    Assessment/Plan:  Theresa Ramos  is a 49 y.o.  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 It is asymptomatic and benign (endometriosis on biopsy). In order to resect it, the patient would require a trachelectomy, upper vagina vaginectomy, low anterior resection and reanastomosis. Because it is asymptomatic, she is electing for a trial of conservative medical therapy rather than proceding with such a radical surgery. She is symptomatic on OCP's with spotting and skin changes.  1/ stop OCP's. 2/ Depo Lupron 11.25mg  x 3 months.  3/ repeat MRI of pelvis in 2 months with followup with me. If lesion is regressing, will continue Lupron with add back hormonal therapy. If progressing, will recommend resection.   HPI: Theresa Ramos is an extremely pleasant 49 year old gravida 0 who was seen in consultation at the request of Dr. Gaetano Net for rectovaginal mass on May 24th 2016.  The patient has a history of an exploratory laparotomy and supracervical hysterectomy on 08/08/2011 for menorrhagia. Intraoperative findings were significant for a 16 week fibroid uterus and extensive pelvic endometriosis which was causing a proliferation of the rectovaginal septum. The operative note at that time reported a 2-3 cm palpable nodule consistent with endometriosis from the rectum in the posterior cul-de-sac. For this reason a supracervical hysterectomy was performed and left the cervical stump in situ. Postoperatively the patient reports having a colonoscopy. I have the report of your colonoscopy from August 2013 and this revealed 4 polyps in the rectum, mild diverticulitis and sigmoid colon, and hemorrhoids. No transmural  endometriosis was seen  Postoperatively she continued to have regular cyclical menstrual bleeding. However the menorrhagia have resolved. She denies intermenstrual bleeding. She does report occasional mild postcoital spotting.  She denies pelvic pain or hematochezia. She denies narrowing of stool caliber. She denies tenesmus. She is asymptomatic from this pelvic mass.  She saw Dr. Gaetano Net for her annual gynecologic examination in April 2015. At that time he noted growth in the posterior rectovaginal mass. This prompted him to perform an ultrasound scan on 06/17/2014 which revealed the cervix seen with a 5.1 x 2.5 x 3.4 cm mass that appeared to be originating from the posterior cervix. This mass had a cystic hypoechoic center. No blood flow was seen within the hypoechoic portion. However there is a small metabolic flow seen within the solid area around the hypoechoic portion. No adnexal masses or free fluid was seen.  An MRI of the pelvis was then performed on 07/07/2014. This confirmed normal ovaries. But in the pelvic cul-de-sac between the cervix and the rectum there is an ill-defined mass which shows relative T2 hypointensity, internal cystic foci, diffuse contrast enhancement. The mass measured 4.5 x 3.6 x 3.3 cm. It has characteristics consistent with endometriosis.  She is no history of abnormal Pap smears. Her Pap in April 2016 was negative for dysplasia or malignancy. She does have a family history significant for GI malignancy. Her grandmother had a diagnosis of colon cancer from which she died, and her father had a history of diverticulitis.  A biopsy was taken of the vaginal mass on 07/27/14 and this was benign endometriosis. She elected for conservative therapy with a trial of OCP's.  INTERVAL HX:  She has  been taking orthoevra for 1 month with symptosm of vaginal spotting most days and skin changes. She does not like these symptoms. She denies pelvic pain, dyspareunia, pain with deep  dictation, change in caliber of stools, constipation. She is asymptomatic from the mass with the exception of vaginal spotting.  Current Meds:  Outpatient Encounter Prescriptions as of 09/24/2014  Medication Sig  . ibuprofen (ADVIL,MOTRIN) 200 MG tablet Take 200 mg by mouth every 6 (six) hours as needed.  . Multiple Vitamin (THERA) TABS Take 1 tablet by mouth daily.   . norgestrel-ethinyl estradiol (LO/OVRAL,CRYSELLE) 0.3-30 MG-MCG tablet Take 1 tablet by mouth daily.   Facility-Administered Encounter Medications as of 09/24/2014  Medication  . leuprolide (LUPRON) injection 11.25 mg    Allergy:  Allergies  Allergen Reactions  . Adhesive [Tape] Rash    Social Hx:   History   Social History  . Marital Status: Married    Spouse Name: N/A  . Number of Children: N/A  . Years of Education: N/A   Occupational History  . Not on file.   Social History Main Topics  . Smoking status: Never Smoker   . Smokeless tobacco: Not on file  . Alcohol Use: Yes     Comment: occasional  . Drug Use: No  . Sexual Activity: Yes    Birth Control/ Protection: Pill   Other Topics Concern  . Not on file   Social History Narrative    Past Surgical Hx:  Past Surgical History  Procedure Laterality Date  . Dilation and curettage of uterus    . Abdominal hysterectomy  08/08/2011    Procedure: HYSTERECTOMY ABDOMINAL;  Surgeon: Allena Katz, MD;  Location: [facility];  Service: Gynecology;  Laterality: N/A;  Supercervical Abdominal Hysterectomy    Past Medical Hx:  Past Medical History  Diagnosis Date  . Anemia     Past Gynecological History:  G0. No history of abnormal paps.  No LMP recorded.  Family Hx:  Family History  Problem Relation Age of Onset  . Colon cancer Maternal Grandmother   . Lung cancer Maternal Grandfather     Review of Systems:  Constitutional  Feels well,    ENT Normal appearing ears and nares bilaterally Skin/Breast  No rash, sores, jaundice, itching,  dryness Cardiovascular  No chest pain, shortness of breath, or edema  Pulmonary  No cough or wheeze.  Gastro Intestinal  No nausea, vomitting, or diarrhoea. No bright red blood per rectum, no abdominal pain, change in bowel movement, or constipation.  Genito Urinary  No frequency, urgency, dysuria,  Musculo Skeletal  No myalgia, arthralgia, joint swelling or pain  Neurologic  No weakness, numbness, change in gait,  Psychology  No depression, anxiety, insomnia.   Vitals:  Blood pressure 121/71, pulse 70, temperature 98.9 F (37.2 C), temperature source Oral, resp. rate 18, height 5\' 3"  (1.6 m), weight 168 lb 6.4 oz (76.386 kg), SpO2 100 %.  Physical Exam: WD in NAD Neck  Supple NROM, without any enlargements.  Lymph Node Survey No cervical supraclavicular or inguinal adenopathy Cardiovascular  Pulse normal rate, regularity and rhythm. S1 and S2 normal.  Lungs  Clear to auscultation bilateraly, without wheezes/crackles/rhonchi. Good air movement.  Skin  No rash/lesions/breakdown  Psychiatry  Alert and oriented to person, place, and time  Abdomen  Normoactive bowel sounds, abdomen soft, non-tender and mildly overweight without evidence of hernia.  Back No CVA tenderness Genito Urinary  Vulva/vagina: Normal external female genitalia.  No lesions. No discharge  or bleeding.  Bladder/urethra:  No lesions or masses, well supported bladder  Vagina: Friable polypoid tissue erupting from the posterior vaginal fornix separate from the cervix which is otherwise grossly normal in appearance.  Cervix: Normal appearing, no lesions.  Uterus: surgically absent but in its place is a firm, minimally mobile 5-6cm mass, central, not extending to side walls, intimately involving the rectum posteriorally. Stable in size.  Adnexa: no discrete adnexal masses. Rectal  Good tone, there is a stable 5-6 cm rectovaginal septal mass that is densely adherent to the rectum though did not palpably  transmural. Extremities  No bilateral cyanosis, clubbing or edema.   Donaciano Eva, MD   09/24/2014, 1:53 PM

## 2014-09-24 NOTE — Patient Instructions (Signed)
We will contact you once the lupron injection has been prior authorized by your insurance.  We will arrange an appointment to come in for the injection at that time.  Plan for MRI in two months with appt with Dr. Denman George after.  Please call for any questions or concerns.  Leuprolide injection What is this medicine? LEUPROLIDE (loo PROE lide) is a man-made hormone. It is used to treat the symptoms of prostate cancer. This medicine may also be used to treat children with early onset of puberty. It may be used for other hormonal conditions. This medicine may be used for other purposes; ask your health care provider or pharmacist if you have questions. COMMON BRAND NAME(S): Lupron What should I tell my health care provider before I take this medicine? They need to know if you have any of these conditions: -diabetes -heart disease or previous heart attack -high blood pressure -high cholesterol -pain or difficulty passing urine -spinal cord metastasis -stroke -tobacco smoker -an unusual or allergic reaction to leuprolide, benzyl alcohol, other medicines, foods, dyes, or preservatives -pregnant or trying to get pregnant -breast-feeding How should I use this medicine? This medicine is for injection under the skin or into a muscle. You will be taught how to prepare and give this medicine. Use exactly as directed. Take your medicine at regular intervals. Do not take your medicine more often than directed. It is important that you put your used needles and syringes in a special sharps container. Do not put them in a trash can. If you do not have a sharps container, call your pharmacist or healthcare provider to get one. Talk to your pediatrician regarding the use of this medicine in children. While this medicine may be prescribed for children as young as 8 years for selected conditions, precautions do apply. Overdosage: If you think you have taken too much of this medicine contact a poison control center  or emergency room at once. NOTE: This medicine is only for you. Do not share this medicine with others. What if I miss a dose? If you miss a dose, take it as soon as you can. If it is almost time for your next dose, take only that dose. Do not take double or extra doses. What may interact with this medicine? Do not take this medicine with any of the following medications: -chasteberry This medicine may also interact with the following medications: -herbal or dietary supplements, like black cohosh or DHEA -female hormones, like estrogens or progestins and birth control pills, patches, rings, or injections -female hormones, like testosterone This list may not describe all possible interactions. Give your health care provider a list of all the medicines, herbs, non-prescription drugs, or dietary supplements you use. Also tell them if you smoke, drink alcohol, or use illegal drugs. Some items may interact with your medicine. What should I watch for while using this medicine? Visit your doctor or health care professional for regular checks on your progress. During the first week, your symptoms may get worse, but then will improve as you continue your treatment. You may get hot flashes, increased bone pain, increased difficulty passing urine, or an aggravation of nerve symptoms. Discuss these effects with your doctor or health care professional, some of them may improve with continued use of this medicine. Female patients may experience a menstrual cycle or spotting during the first 2 months of therapy with this medicine. If this continues, contact your doctor or health care professional. What side effects may I notice from  receiving this medicine? Side effects that you should report to your doctor or health care professional as soon as possible: -allergic reactions like skin rash, itching or hives, swelling of the face, lips, or tongue -breathing problems -chest pain -depression or memory  disorders -pain in your legs or groin -pain at site where injected -severe headache -swelling of the feet and legs -visual changes -vomiting Side effects that usually do not require medical attention (report to your doctor or health care professional if they continue or are bothersome): -breast swelling or tenderness -decrease in sex drive or performance -diarrhea -hot flashes -loss of appetite -muscle, joint, or bone pains -nausea -redness or irritation at site where injected -skin problems or acne This list may not describe all possible side effects. Call your doctor for medical advice about side effects. You may report side effects to FDA at 1-800-FDA-1088. Where should I keep my medicine? Keep out of the reach of children. Store below 25 degrees C (77 degrees F). Do not freeze. Protect from light. Do not use if it is not clear or if there are particles present. Throw away any unused medicine after the expiration date. NOTE: This sheet is a summary. It may not cover all possible information. If you have questions about this medicine, talk to your doctor, pharmacist, or health care provider.  2015, [employer]vier/[employer] Standard. (2008-07-06 13:26:20)

## 2014-09-27 ENCOUNTER — Telehealth: Payer: Self-pay | Admitting: *Deleted

## 2014-09-27 MED ORDER — LEUPROLIDE ACETATE (3 MONTH) 11.25 MG IM KIT: 11.2500 mg | PACK | INTRAMUSCULAR | Status: DC

## 2014-09-27 NOTE — Addendum Note (Signed)
Addended by: Joylene John D on: 09/27/2014 11:47 AM   Modules accepted: Orders

## 2014-09-27 NOTE — Telephone Encounter (Signed)
Left message on voicemail, with appointment details. Pt has a injection appointment on 09/29/14.

## 2014-09-29 ENCOUNTER — Telehealth: Payer: Self-pay | Admitting: Gynecologic Oncology

## 2014-09-29 ENCOUNTER — Ambulatory Visit: Payer: 59

## 2014-09-29 NOTE — Telephone Encounter (Signed)
Spoke with patient about lupron injections.  Informed that her concerns about side effects were discussed with Dr. Denman George.  Patient informed that Dr. Denman George feels the lupron injections will be more effective at causing the vaginal mass to regress compared to BCP use.  Patient would like to proceed.  Injection appt made for this Friday.  Advised to call for any questions or concerns.

## 2014-09-30 ENCOUNTER — Other Ambulatory Visit: Payer: Self-pay | Admitting: Gynecologic Oncology

## 2014-09-30 DIAGNOSIS — N804 Endometriosis of rectovaginal septum, unspecified involvement of vagina: Secondary | ICD-10-CM

## 2014-10-01 ENCOUNTER — Ambulatory Visit (HOSPITAL_BASED_OUTPATIENT_CLINIC_OR_DEPARTMENT_OTHER): Payer: 59

## 2014-10-01 VITALS — BP 115/68 | HR 63 | Temp 98.7°F

## 2014-10-01 DIAGNOSIS — N804 Endometriosis of rectovaginal septum, unspecified involvement of vagina: Secondary | ICD-10-CM

## 2014-10-01 DIAGNOSIS — R1900 Intra-abdominal and pelvic swelling, mass and lump, unspecified site: Secondary | ICD-10-CM

## 2014-10-01 MED ORDER — LEUPROLIDE ACETATE (3 MONTH) 11.25 MG IM KIT
11.2500 mg | PACK | INTRAMUSCULAR | Status: DC
  Administered 2014-10-01: 11.25 mg via INTRAMUSCULAR
  Filled 2014-10-01: qty 11.25

## 2014-10-01 MED ORDER — LEUPROLIDE ACETATE (3 MONTH) 11.25 MG IM KIT: 11.2500 mg | PACK | INTRAMUSCULAR | Status: DC

## 2014-10-01 NOTE — Patient Instructions (Signed)
Leuprolide depot injection or implant What is this medicine? LEUPROLIDE (loo PROE lide) is a man-made protein that acts like a natural hormone in the body. It decreases testosterone in men and decreases estrogen in women. In men, this medicine is used to treat advanced prostate cancer. In women, some forms of this medicine may be used to treat endometriosis, uterine fibroids, or other female hormone-related problems. This medicine may be used for other purposes; ask your health care provider or pharmacist if you have questions. COMMON BRAND NAME(S): Eligard, Lupron Depot, Lupron Depot-Ped, Viadur What should I tell my health care provider before I take this medicine? They need to know if you have any of these conditions: -diabetes -heart disease or previous heart attack -high blood pressure -high cholesterol -osteoporosis -pain or difficulty passing urine -spinal cord metastasis -stroke -tobacco smoker -unusual vaginal bleeding (women) -an unusual or allergic reaction to leuprolide, benzyl alcohol, other medicines, foods, dyes, or preservatives -pregnant or trying to get pregnant -breast-feeding How should I use this medicine? This medicine is for injection into a muscle or for implant or injection under the skin. It is given by a health care professional in a hospital or clinic setting. The specific product will determine how it will be given to you. Make sure you understand which product you receive and how often you will receive it. Talk to your pediatrician regarding the use of this medicine in children. Special care may be needed. Overdosage: If you think you have taken too much of this medicine contact a poison control center or emergency room at once. NOTE: This medicine is only for you. Do not share this medicine with others. What if I miss a dose? It is important not to miss a dose. Call your doctor or health care professional if you are unable to keep an appointment. Depot  injections: Depot injections are given either once-monthly, every 12 weeks, every 16 weeks, or every 24 weeks depending on the product you are prescribed. The product you are prescribed will be based on if you are female or female, and your condition. Make sure you understand your product and dosing. Implant dosing: The implant is removed and replaced once a year. The implant is only used in males. What may interact with this medicine? Do not take this medicine with any of the following medications: -chasteberry This medicine may also interact with the following medications: -herbal or dietary supplements, like black cohosh or DHEA -female hormones, like estrogens or progestins and birth control pills, patches, rings, or injections -female hormones, like testosterone This list may not describe all possible interactions. Give your health care provider a list of all the medicines, herbs, non-prescription drugs, or dietary supplements you use. Also tell them if you smoke, drink alcohol, or use illegal drugs. Some items may interact with your medicine. What should I watch for while using this medicine? Visit your doctor or health care professional for regular checks on your progress. During the first weeks of treatment, your symptoms may get worse, but then will improve as you continue your treatment. You may get hot flashes, increased bone pain, increased difficulty passing urine, or an aggravation of nerve symptoms. Discuss these effects with your doctor or health care professional, some of them may improve with continued use of this medicine. Female patients may experience a menstrual cycle or spotting during the first months of therapy with this medicine. If this continues, contact your doctor or health care professional. What side effects may I notice from   receiving this medicine? Side effects that you should report to your doctor or health care professional as soon as possible: -allergic reactions like  skin rash, itching or hives, swelling of the face, lips, or tongue -breathing problems -chest pain -depression or memory disorders -pain in your legs or groin -pain at site where injected or implanted -severe headache -swelling of the feet and legs -visual changes -vomiting Side effects that usually do not require medical attention (report to your doctor or health care professional if they continue or are bothersome): -breast swelling or tenderness -decrease in sex drive or performance -diarrhea -hot flashes -loss of appetite -muscle, joint, or bone pains -nausea -redness or irritation at site where injected or implanted -skin problems or acne This list may not describe all possible side effects. Call your doctor for medical advice about side effects. You may report side effects to FDA at 1-800-FDA-1088. Where should I keep my medicine? This drug is given in a hospital or clinic and will not be stored at home. NOTE: This sheet is a summary. It may not cover all possible information. If you have questions about this medicine, talk to your doctor, pharmacist, or health care provider.  2015, Elsevier/Gold Standard. (2009-08-23 14:41:21)  

## 2014-11-25 ENCOUNTER — Ambulatory Visit (HOSPITAL_COMMUNITY): Payer: 59

## 2014-11-25 ENCOUNTER — Ambulatory Visit (HOSPITAL_COMMUNITY)
Admission: RE | Admit: 2014-11-25 | Discharge: 2014-11-25 | Disposition: A | Discharge status: Home / Self Care | Payer: 59 | Source: Ambulatory Visit | Attending: Gynecologic Oncology | Admitting: Gynecologic Oncology

## 2014-11-25 DIAGNOSIS — R1900 Intra-abdominal and pelvic swelling, mass and lump, unspecified site: Secondary | ICD-10-CM | POA: Diagnosis present

## 2014-11-25 MED ORDER — GADOBENATE DIMEGLUMINE 529 MG/ML IV SOLN
15.0000 mL | Freq: Once | INTRAVENOUS | Status: AC | PRN
  Administered 2014-11-25: 14 mL via INTRAVENOUS

## 2014-11-29 ENCOUNTER — Encounter: Payer: Self-pay | Admitting: Gynecologic Oncology

## 2014-11-29 ENCOUNTER — Ambulatory Visit: Payer: 59 | Attending: Gynecologic Oncology | Admitting: Gynecologic Oncology

## 2014-11-29 VITALS — BP 119/71 | HR 73 | Temp 98.5°F | Resp 20 | Ht 63.0 in | Wt 167.6 lb

## 2014-11-29 DIAGNOSIS — N804 Endometriosis of rectovaginal septum, unspecified involvement of vagina: Secondary | ICD-10-CM

## 2014-11-29 MED ORDER — ALPRAZOLAM 0.5 MG PO TABS
0.5000 mg | ORAL_TABLET | Freq: Once | ORAL | Status: DC | PRN
Start: 2014-11-29 — End: 2014-11-29

## 2014-11-29 MED ORDER — ALPRAZOLAM 0.5 MG PO TABS: 0.5000 mg | ORAL_TABLET | Freq: Once | ORAL | Status: AC | PRN

## 2014-11-29 NOTE — Progress Notes (Signed)
Followup Note: Gyn-Onc  Consult was initially requested by Dr. Everlene Farrier for the evaluation of Theresa Ramos 49 y.o. female with rectovaginal endometrioma  CC:  Chief Complaint  Patient presents with  . endometriosis of rectovaginal septum    follow-up    Assessment/Plan:  [name]  is a 49 y.o.  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 It is asymptomatic and benign (endometriosis on biopsy). In order to resect it, the patient would require a trachelectomy, upper vagina vaginectomy, low anterior resection and reanastomosis. Because it is asymptomatic, she is electing for a trial of conservative medical therapy rather than proceding with such a radical surgery. She is responding to Lupron s/p first dose July 2016. 1/ continue Depo Lupron 11.25mg  x 3 months. (2nd dose) 2/ repeat MRI of pelvis in 4 months with followup with me. If lesion is continue to regress, will continue Lupron. If progressing, will recommend resection.   HPI: Theresa Ramos is an extremely pleasant 49 year old gravida 0 who was seen in consultation at the request of Dr. Gaetano Net for rectovaginal mass on May 24th 2016.  The patient has a history of an exploratory laparotomy and supracervical hysterectomy on 08/08/2011 for menorrhagia. Intraoperative findings were significant for a 16 week fibroid uterus and extensive pelvic endometriosis which was causing a proliferation of the rectovaginal septum. The operative note at that time reported a 2-3 cm palpable nodule consistent with endometriosis from the rectum in the posterior cul-de-sac. For this reason a supracervical hysterectomy was performed and left the cervical stump in situ. Postoperatively the patient reports having a colonoscopy. I have the report of your colonoscopy from August 2013 and this revealed 4 polyps in the rectum, mild diverticulitis and sigmoid colon, and hemorrhoids. No transmural endometriosis was  seen  Postoperatively she continued to have regular cyclical menstrual bleeding. However the menorrhagia have resolved. She denies intermenstrual bleeding. She does report occasional mild postcoital spotting.  She denies pelvic pain or hematochezia. She denies narrowing of stool caliber. She denies tenesmus. She is asymptomatic from this pelvic mass.  She saw Dr. Gaetano Net for her annual gynecologic examination in April 2015. At that time he noted growth in the posterior rectovaginal mass. This prompted him to perform an ultrasound scan on 06/17/2014 which revealed the cervix seen with a 5.1 x 2.5 x 3.4 cm mass that appeared to be originating from the posterior cervix. This mass had a cystic hypoechoic center. No blood flow was seen within the hypoechoic portion. However there is a small metabolic flow seen within the solid area around the hypoechoic portion. No adnexal masses or free fluid was seen.  An MRI of the pelvis was then performed on 07/07/2014. This confirmed normal ovaries. But in the pelvic cul-de-sac between the cervix and the rectum there is an ill-defined mass which shows relative T2 hypointensity, internal cystic foci, diffuse contrast enhancement. The mass measured 4.5 x 3.6 x 3.3 cm. It has characteristics consistent with endometriosis.  She is no history of abnormal Pap smears. Her Pap in April 2016 was negative for dysplasia or malignancy. She does have a family history significant for GI malignancy. Her grandmother had a diagnosis of colon cancer from which she died, and her father had a history of diverticulitis.  A biopsy was taken of the vaginal mass on 07/27/14 and this was benign endometriosis. She elected for conservative therapy with a trial of OCP's. However, became symptomatic with nausea and spotting on these. Therefore, in  July, 2016 we tried Depot Lupron 11.25mg  q 3 months.  INTERVAL HX:  Since starting Lupron she denies vaginal spotting. She has some hot flashes at  night but no other menopausal symptoms. She has been amenorrheic in August and September. She does not like these symptoms. She denies pelvic pain, dyspareunia, pain with deep dictation, change in caliber of stools, constipation. She is asymptomatic from the mass with the exception of vaginal spotting.  MRI 11/26/14 revealed response to the Lupron with the mass now measuring 3.5x2.3x2.7cm (reduced from 4.5x3.6x3.3cm).   Current Meds:  Outpatient Encounter Prescriptions as of 11/29/2014  Medication Sig  . ibuprofen (ADVIL,MOTRIN) 200 MG tablet Take 200 mg by mouth every 6 (six) hours as needed.  Marland Kitchen leuprolide (LUPRON) 11.25 MG injection Inject 11.25 mg into the muscle every 3 (three) months.  . Multiple Vitamin (THERA) TABS Take 1 tablet by mouth daily.   . [DISCONTINUED] norgestrel-ethinyl estradiol (LO/OVRAL,CRYSELLE) 0.3-30 MG-MCG tablet Take 1 tablet by mouth daily.   No facility-administered encounter medications on file as of 11/29/2014.    Allergy:  Allergies  Allergen Reactions  . Adhesive [Tape] Rash    Social Hx:   Social History   Social History  . Marital Status: Married    Spouse Name: N/A  . Number of Children: N/A  . Years of Education: N/A   Occupational History  . Not on file.   Social History Main Topics  . Smoking status: Never Smoker   . Smokeless tobacco: Not on file  . Alcohol Use: Yes     Comment: occasional  . Drug Use: No  . Sexual Activity: Yes    Birth Control/ Protection: Pill   Other Topics Concern  . Not on file   Social History Narrative    Past Surgical Hx:  Past Surgical History  Procedure Laterality Date  . Dilation and curettage of uterus    . Abdominal hysterectomy  08/08/2011    Procedure: HYSTERECTOMY ABDOMINAL;  Surgeon: Allena Katz, MD;  Location: [facility];  Service: Gynecology;  Laterality: N/A;  Supercervical Abdominal Hysterectomy    Past Medical Hx:  Past Medical History  Diagnosis Date  . Anemia     Past  Gynecological History:  G0. No history of abnormal paps.  No LMP recorded.  Family Hx:  Family History  Problem Relation Age of Onset  . Colon cancer Maternal Grandmother   . Lung cancer Maternal Grandfather     Review of Systems:  Constitutional  Feels well,    ENT Normal appearing ears and nares bilaterally Skin/Breast  No rash, sores, jaundice, itching, dryness Cardiovascular  No chest pain, shortness of breath, or edema  Pulmonary  No cough or wheeze.  Gastro Intestinal  No nausea, vomitting, or diarrhoea. No bright red blood per rectum, no abdominal pain, change in bowel movement, or constipation.  Genito Urinary  No frequency, urgency, dysuria,  Musculo Skeletal  No myalgia, arthralgia, joint swelling or pain  Neurologic  No weakness, numbness, change in gait,  Psychology  No depression, anxiety, insomnia.   Vitals:  Blood pressure 119/71, pulse 73, temperature 98.5 F (36.9 C), temperature source Oral, resp. rate 20, height 5\' 3"  (1.6 m), weight 167 lb 9.6 oz (76.023 kg), SpO2 100 %.  Physical Exam: WD in NAD Neck  Supple NROM, without any enlargements.  Lymph Node Survey No cervical supraclavicular or inguinal adenopathy Cardiovascular  Pulse normal rate, regularity and rhythm. S1 and S2 normal.  Lungs  Clear to auscultation  bilateraly, without wheezes/crackles/rhonchi. Good air movement.  Skin  No rash/lesions/breakdown  Psychiatry  Alert and oriented to person, place, and time  Abdomen  Normoactive bowel sounds, abdomen soft, non-tender and mildly overweight without evidence of hernia.  Back No CVA tenderness Genito Urinary  Vulva/vagina: Normal external female genitalia.  No lesions. No discharge or bleeding.  Bladder/urethra:  No lesions or masses, well supported bladder  Vagina:Tissue is less friable and polypoid (erupting from the posterior vaginal fornix separate from the cervix) than on prior exams.  Cervix: Normal appearing, no  lesions.  Uterus: surgically absent but in its place is a firm, minimally mobile 3-4cm mass, central, not extending to side walls, intimately involving the rectum posteriorally. Reduced in size.  Adnexa: no discrete adnexal masses. Rectal  Good tone, there is a 4 cm rectovaginal septal mass that is densely adherent to the rectum though did not palpably transmural. It has clearly reduced in size since last exam. It is more mobile than last exam. Extremities  No bilateral cyanosis, clubbing or edema.   Donaciano Eva, MD   11/29/2014, 3:01 PM

## 2014-11-29 NOTE — Patient Instructions (Signed)
Plan for your lupron injection on October 31.  Then you will have a MRI and see Dr. Denman George in Jan 2017.  Please call at the end of Nov to schedule your MRI and appt with Dr. Denman George.  We could do the same day for your MRI and appt with Denman George if that is easier.

## 2015-01-03 ENCOUNTER — Other Ambulatory Visit: Payer: Self-pay | Admitting: *Deleted

## 2015-01-03 ENCOUNTER — Ambulatory Visit (HOSPITAL_BASED_OUTPATIENT_CLINIC_OR_DEPARTMENT_OTHER): Payer: 59

## 2015-01-03 VITALS — BP 121/74 | HR 59 | Temp 98.2°F

## 2015-01-03 DIAGNOSIS — N804 Endometriosis of rectovaginal septum, unspecified involvement of vagina: Secondary | ICD-10-CM

## 2015-01-03 MED ORDER — LEUPROLIDE ACETATE (3 MONTH) 11.25 MG IM KIT
11.2500 mg | PACK | Freq: Once | INTRAMUSCULAR | Status: AC
  Administered 2015-01-03: 11.25 mg via INTRAMUSCULAR
  Filled 2015-01-03: qty 11.25

## 2015-02-10 ENCOUNTER — Telehealth: Payer: Self-pay | Admitting: Gynecologic Oncology

## 2015-02-10 NOTE — Telephone Encounter (Signed)
Patient called yesterday afternoon asking if she could have her MRI at the end of Dec due to insurance reasons.  Spoke with Dr. Denman George this am who stated it would be fine for the patient to proceed with the MRI at the end of Dec.  Information left for the patient.  Advised she would receive a phone call from our schedulers with a date and time at [facility] ([place] per pt request) for the end of Dec.  Advised to call for any questions or concerns.

## 2015-03-03 ENCOUNTER — Other Ambulatory Visit: Payer: 59

## 2015-03-04 ENCOUNTER — Ambulatory Visit
Admission: RE | Admit: 2015-03-04 | Discharge: 2015-03-04 | Disposition: A | Discharge status: Home / Self Care | Payer: 59 | Source: Ambulatory Visit | Attending: Gynecologic Oncology | Admitting: Gynecologic Oncology

## 2015-03-04 DIAGNOSIS — N804 Endometriosis of rectovaginal septum, unspecified involvement of vagina: Secondary | ICD-10-CM

## 2015-03-04 MED ORDER — GADOBENATE DIMEGLUMINE 529 MG/ML IV SOLN
15.0000 mL | Freq: Once | INTRAVENOUS | Status: AC | PRN
  Administered 2015-03-04: 15 mL via INTRAVENOUS

## 2015-03-07 ENCOUNTER — Telehealth: Payer: Self-pay | Admitting: Gynecologic Oncology

## 2015-03-07 NOTE — Telephone Encounter (Signed)
Decrease in size of lesion on lupron. Plan to continue therapy.

## 2015-03-08 ENCOUNTER — Telehealth: Payer: Self-pay | Admitting: Gynecologic Oncology

## 2015-03-08 NOTE — Telephone Encounter (Signed)
Called patient and informed her of Dr. Serita Grit recommendations to continue lupron therapy since the endometrioma continues to decrease in size.  Patient asking about risks with prolonged use and wanting to know if there is a cheaper maintenance therapy.  She states she has to pay $1000 after insurance for each injection.  Advised that Dr. Denman George would be made aware of her concerns and she would be contacted with her recommendations.

## 2015-03-09 ENCOUNTER — Telehealth: Payer: Self-pay | Admitting: Gynecologic Oncology

## 2015-03-09 NOTE — Telephone Encounter (Signed)
Left message for patient about scheduling a follow up appt with Dr. Denman George and Dr. Serita Grit other option instead of lupron being OCPs.  Advised to please call back to the office so an appt could be made and so treatment options could be discussed.

## 2015-04-28 ENCOUNTER — Other Ambulatory Visit: Payer: Self-pay | Admitting: Gynecologic Oncology

## 2015-04-28 ENCOUNTER — Telehealth: Payer: Self-pay | Admitting: Gynecologic Oncology

## 2015-04-28 DIAGNOSIS — N804 Endometriosis of rectovaginal septum, unspecified involvement of vagina: Secondary | ICD-10-CM

## 2015-04-28 MED ORDER — LEUPROLIDE ACETATE (3 MONTH) 11.25 MG IM KIT: 11.2500 mg | PACK | INTRAMUSCULAR | Status: AC

## 2015-04-28 NOTE — Progress Notes (Signed)
Script sent to [facility] per pt request.  Patient is planning on picking up lupron injection and bringing it to the [facility] for administration. She is to call for any needs or concerns.

## 2015-04-28 NOTE — Telephone Encounter (Signed)
Patient called wondering if she could obtain the lupron injection using a GoodRx card at a pharmacy and bring it to the [facility] for injection.  Her follow up is scheduled for 05/23/15.  Situation discussed with Dr. Denman George.  Stating patient can get lupron injection at a pharmacy and bring it to the [facility] for administration.  Patient is going to call with the pharmacy of her choosing for [place] to send the script to.

## 2015-05-23 ENCOUNTER — Encounter: Payer: Self-pay | Admitting: Gynecologic Oncology

## 2015-05-23 ENCOUNTER — Ambulatory Visit: Payer: 59 | Attending: Gynecologic Oncology | Admitting: Gynecologic Oncology

## 2015-05-23 VITALS — BP 111/72 | HR 89 | Temp 98.5°F | Resp 18 | Wt 165.2 lb

## 2015-05-23 DIAGNOSIS — Z800 Family history of malignant neoplasm of digestive organs: Secondary | ICD-10-CM | POA: Insufficient documentation

## 2015-05-23 DIAGNOSIS — K5792 Diverticulitis of intestine, part unspecified, without perforation or abscess without bleeding: Secondary | ICD-10-CM | POA: Insufficient documentation

## 2015-05-23 DIAGNOSIS — N804 Endometriosis of rectovaginal septum, unspecified involvement of vagina: Secondary | ICD-10-CM

## 2015-05-23 DIAGNOSIS — D6489 Other specified anemias: Secondary | ICD-10-CM | POA: Insufficient documentation

## 2015-05-23 DIAGNOSIS — K621 Rectal polyp: Secondary | ICD-10-CM | POA: Insufficient documentation

## 2015-05-23 NOTE — Patient Instructions (Signed)
Plan to have your MRI at the end of June.  You can call [phone] to schedule your MRI.  Plan to follow up after your MRI with Dr. Denman George the beginning of July.  Please call for any questions or concerns.

## 2015-05-23 NOTE — Progress Notes (Signed)
Followup Note: Gyn-Onc  Consult was initially requested by Dr. Everlene Farrier for the evaluation of Theresa Ramos 50 y.o. female with rectovaginal endometrioma  CC:  Chief Complaint  Patient presents with  . Endometriosis of rectovaginal septum    Follow up    Assessment/Plan:  [name]  is a 50 y.o.  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 It is asymptomatic and benign (endometriosis on biopsy). In order to resect it, the patient would require a trachelectomy, upper vagina vaginectomy, low anterior resection and reanastomosis. Because it is asymptomatic, she is electing for a trial of conservative medical therapy rather than proceding with such a radical surgery. She partially responded to Lupron s/p first dose July 2016, second dose October 2016. 1/ hold off additional Lupron for now as asymptomatic and Lupron prohibitively expensive. 2/ repeat MRI of pelvis in 4 months with followup with me and MRI prior to visit. If lesion growing off of therapy, would recontemplate resection.    HPI: Theresa Ramos is an extremely pleasant 50 year old gravida 0 who was seen in consultation at the request of Dr. Gaetano Net for rectovaginal mass on May 24th 2016.  The patient has a history of an exploratory laparotomy and supracervical hysterectomy on 08/08/2011 for menorrhagia. Intraoperative findings were significant for a 16 week fibroid uterus and extensive pelvic endometriosis which was causing a proliferation of the rectovaginal septum. The operative note at that time reported a 2-3 cm palpable nodule consistent with endometriosis from the rectum in the posterior cul-de-sac. For this reason a supracervical hysterectomy was performed and left the cervical stump in situ. Postoperatively the patient reports having a colonoscopy. I have the report of your colonoscopy from August 2013 and this revealed 4 polyps in the rectum, mild diverticulitis and sigmoid  colon, and hemorrhoids. No transmural endometriosis was seen  Postoperatively she continued to have regular cyclical menstrual bleeding. However the menorrhagia have resolved. She denies intermenstrual bleeding. She does report occasional mild postcoital spotting.  She denies pelvic pain or hematochezia. She denies narrowing of stool caliber. She denies tenesmus. She is asymptomatic from this pelvic mass.  She saw Dr. Gaetano Net for her annual gynecologic examination in April 2015. At that time he noted growth in the posterior rectovaginal mass. This prompted him to perform an ultrasound scan on 06/17/2014 which revealed the cervix seen with a 5.1 x 2.5 x 3.4 cm mass that appeared to be originating from the posterior cervix. This mass had a cystic hypoechoic center. No blood flow was seen within the hypoechoic portion. However there is a small metabolic flow seen within the solid area around the hypoechoic portion. No adnexal masses or free fluid was seen.  An MRI of the pelvis was then performed on 07/07/2014. This confirmed normal ovaries. But in the pelvic cul-de-sac between the cervix and the rectum there is an ill-defined mass which shows relative T2 hypointensity, internal cystic foci, diffuse contrast enhancement. The mass measured 4.5 x 3.6 x 3.3 cm. It has characteristics consistent with endometriosis.  She is no history of abnormal Pap smears. Her Pap in April 2016 was negative for dysplasia or malignancy. She does have a family history significant for GI malignancy. Her grandmother had a diagnosis of colon cancer from which she died, and her father had a history of diverticulitis.  A biopsy was taken of the vaginal mass on 07/27/14 and this was benign endometriosis. She elected for conservative therapy with a trial of OCP's. However, became  symptomatic with nausea and spotting on these. Therefore, in July, 2016 we tried Depot Lupron 11.25mg  q 3 months.  MRI 11/26/14 revealed response to the  Lupron with the mass now measuring 3.5x2.3x2.7cm (reduced from 4.5x3.6x3.3cm).  Second dose Lupron in October 2016.  INTERVAL HX:  Follow-up MRI of pelvis in December showed continued decrease in size of lesion (now 3.1, x3x2.3) She feels well, no spotting, no rectal symptoms.  Current Meds:  Outpatient Encounter Prescriptions as of 05/23/2015  Medication Sig  . ALPRAZolam (XANAX) 0.5 MG tablet Take 1 tablet (0.5 mg total) by mouth once as needed for anxiety.  Marland Kitchen ibuprofen (ADVIL,MOTRIN) 200 MG tablet Take 200 mg by mouth every 6 (six) hours as needed.  Marland Kitchen leuprolide (LUPRON) 11.25 MG injection Inject 11.25 mg into the muscle every 3 (three) months.  . Multiple Vitamin (THERA) TABS Take 1 tablet by mouth daily.   . norgestrel-ethinyl estradiol (LO/OVRAL,CRYSELLE) 0.3-30 MG-MCG tablet Take by mouth.   No facility-administered encounter medications on file as of 05/23/2015.    Allergy:  Allergies  Allergen Reactions  . Adhesive [Tape] Rash    Social Hx:   Social History   Social History  . Marital Status: Married    Spouse Name: N/A  . Number of Children: N/A  . Years of Education: N/A   Occupational History  . Not on file.   Social History Main Topics  . Smoking status: Never Smoker   . Smokeless tobacco: Not on file  . Alcohol Use: Yes     Comment: occasional  . Drug Use: No  . Sexual Activity: Yes    Birth Control/ Protection: Pill   Other Topics Concern  . Not on file   Social History Narrative    Past Surgical Hx:  Past Surgical History  Procedure Laterality Date  . Dilation and curettage of uterus    . Abdominal hysterectomy  08/08/2011    Procedure: HYSTERECTOMY ABDOMINAL;  Surgeon: Allena Katz, MD;  Location: [facility];  Service: Gynecology;  Laterality: N/A;  Supercervical Abdominal Hysterectomy    Past Medical Hx:  Past Medical History  Diagnosis Date  . Anemia     Past Gynecological History:  G0. No history of abnormal paps.  No LMP  recorded.  Family Hx:  Family History  Problem Relation Age of Onset  . Colon cancer Maternal Grandmother   . Lung cancer Maternal Grandfather     Review of Systems:  Constitutional  Feels well,    ENT Normal appearing ears and nares bilaterally Skin/Breast  No rash, sores, jaundice, itching, dryness Cardiovascular  No chest pain, shortness of breath, or edema  Pulmonary  No cough or wheeze.  Gastro Intestinal  No nausea, vomitting, or diarrhoea. No bright red blood per rectum, no abdominal pain, change in bowel movement, or constipation.  Genito Urinary  No frequency, urgency, dysuria,  Musculo Skeletal  No myalgia, arthralgia, joint swelling or pain  Neurologic  No weakness, numbness, change in gait,  Psychology  No depression, anxiety, insomnia.   Vitals:  Blood pressure 111/72, pulse 89, temperature 98.5 F (36.9 C), temperature source Oral, resp. rate 18, weight 165 lb 3.2 oz (74.934 kg), SpO2 99 %.  Physical Exam: WD in NAD Neck  Supple NROM, without any enlargements.  Lymph Node Survey No cervical supraclavicular or inguinal adenopathy Cardiovascular  Pulse normal rate, regularity and rhythm. S1 and S2 normal.  Lungs  Clear to auscultation bilateraly, without wheezes/crackles/rhonchi. Good air movement.  Skin  No  rash/lesions/breakdown  Psychiatry  Alert and oriented to person, place, and time  Abdomen  Normoactive bowel sounds, abdomen soft, non-tender and mildly overweight without evidence of hernia.  Back No CVA tenderness Genito Urinary  Vulva/vagina: Normal external female genitalia.  No lesions. No discharge or bleeding.  Bladder/urethra:  No lesions or masses, well supported bladder  Vagina:Tissue is less friable and polypoid (erupting from the posterior vaginal fornix separate from the cervix) than on prior exams.  Cervix: Normal appearing, no lesions.  Uterus: surgically absent but in its place is a firm, minimally mobile 3-4cm mass, central,  not extending to side walls, intimately involving the rectum posteriorally. Reduced in size.  Adnexa: no discrete adnexal masses. Rectal  Good tone, there is a 4 cm rectovaginal septal mass that is densely adherent to the rectum though did not palpably transmural. It has clearly reduced in size since original exam. It is more mobile than original exam. Extremities  No bilateral cyanosis, clubbing or edema.   Donaciano Eva, MD   05/23/2015, 3:24 PM

## 2016-12-30 IMAGING — MR MR PELVIS WO/W CM
9 of 16 series · 23 of 48 positions shown · IV contrast (multihance)
Comparison: None.

CLINICAL DATA: Previous supracervical hysterectomy for fibroids and
endometriosis. Posterior pelvic cul-de-sac mass.

EXAM:
MRI PELVIS WITHOUT AND WITH CONTRAST
TECHNIQUE: Multiplanar multisequence MR imaging of the pelvis was performed
both before and after administration of intravenous contrast.
CONTRAST:  15mL MULTIHANCE GADOBENATE DIMEGLUMINE 529 MG/ML IV SOLN

[Series 2: cor haste · coronal · 6.0mm · 0.70mm/px · 1 of 20 slices shown]
[im 1/20]
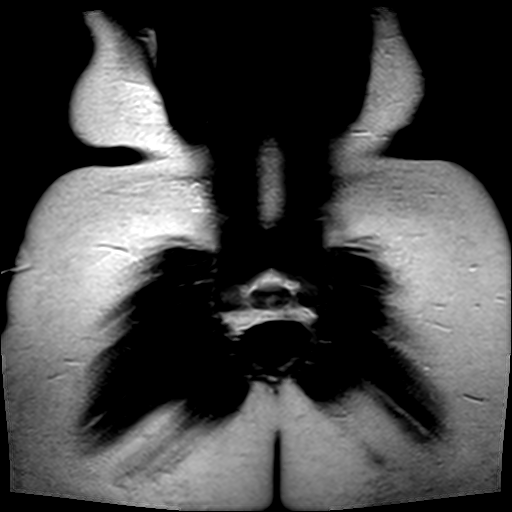

[Series 3: t2_tse_sag · sagittal · 5.0mm · 0.53mm/px · 1 of 27 slices shown]
[im 1/27]
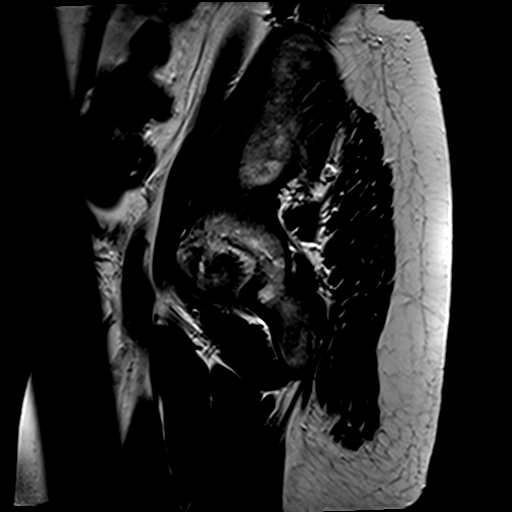

[Series 4: t2_tse axial · axial · 5.0mm · 0.98mm/px · z∈[-115,+59]mm · 2 of 30 slices shown]
[im 1/30]
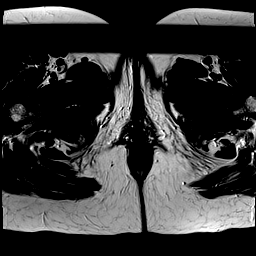
[im 30/30]
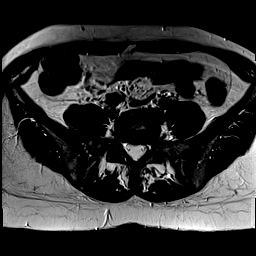

[Series 5: t2_tse axial fs · axial · 5.0mm · 0.49mm/px · z∈[-115,+59]mm · 2 of 30 slices shown]
[im 1/30]
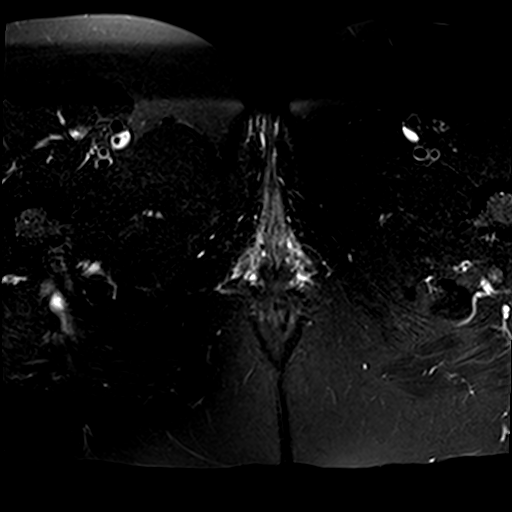
[im 30/30]
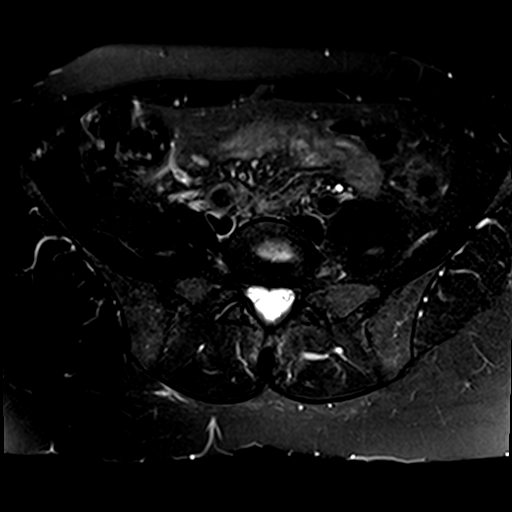

[Series 6: axial spgr · axial · 5.0mm · 0.49mm/px · z∈[-123,+51]mm · 3 of 30 slices shown]
[im 1/30]
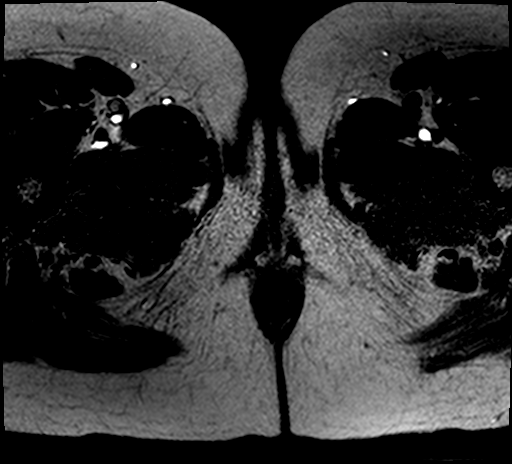
[im 15/30]
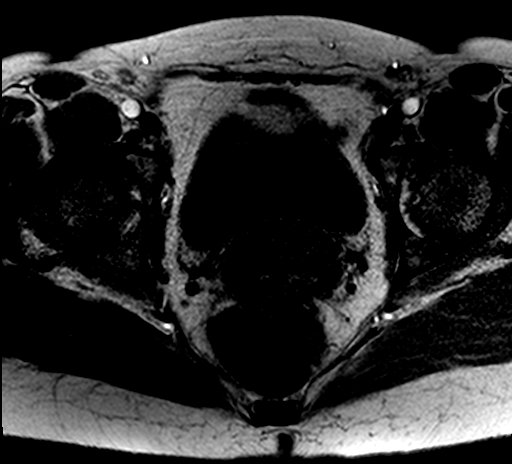
[im 30/30]
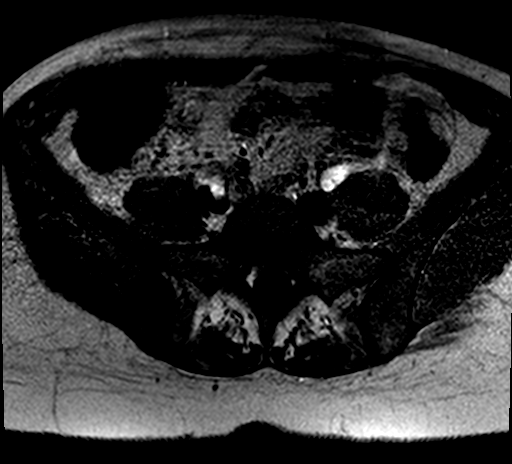

[Series 7: axial spgr pre · axial · non-contrast · 5.0mm · 0.98mm/px · z∈[-123,+51]mm · 3 of 30 slices shown]
[im 1/30]
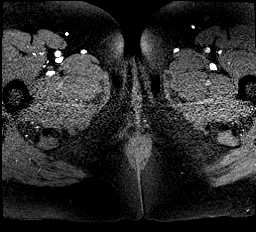
[im 15/30]
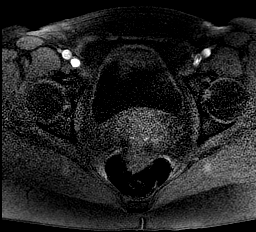
[im 30/30]
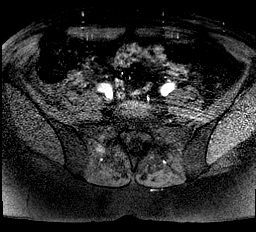

[Series 8: T1 dynamic · sagittal · non-contrast · 4.0mm · 0.49mm/px · 4 of 44 slices shown]
[im 1/44]
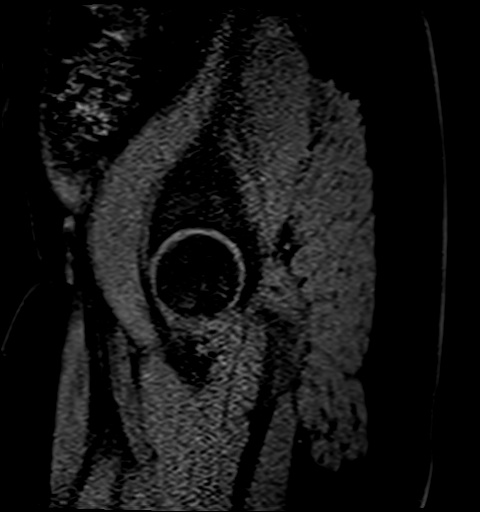
[im 15/44]
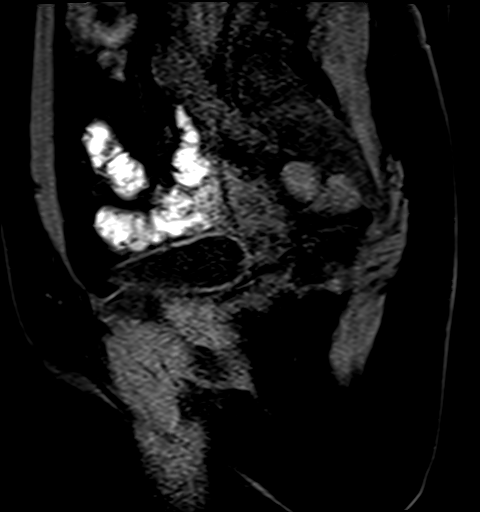
[im 29/44]
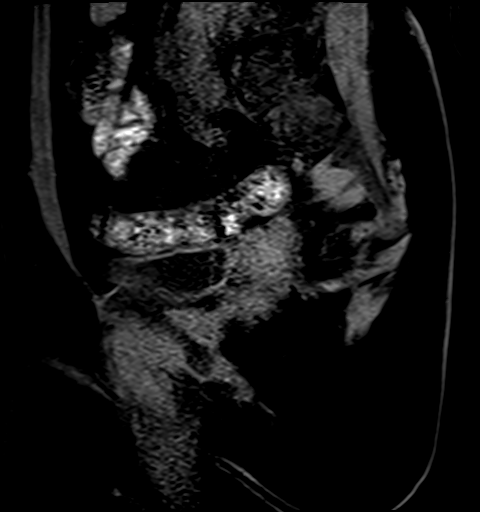
[im 44/44]
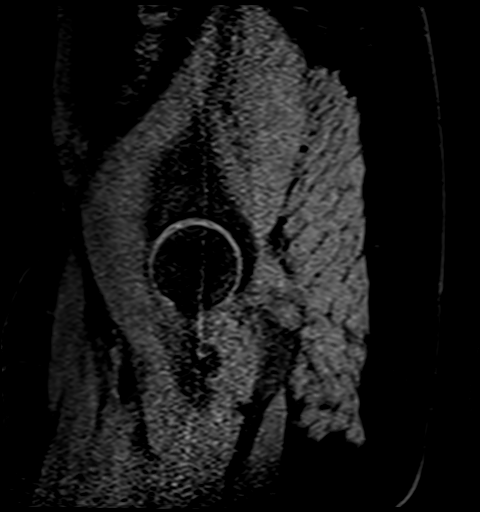

[Series 9: T1 dynamic post-contrast · sagittal · 4.0mm · 0.49mm/px · 4 of 44 slices shown (1 of 2)]
[im 1/44]
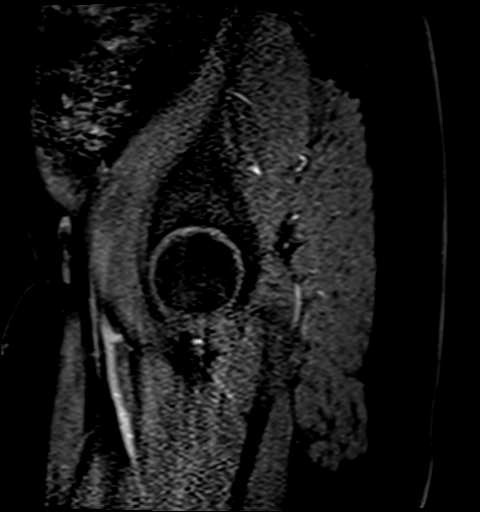
[im 15/44]
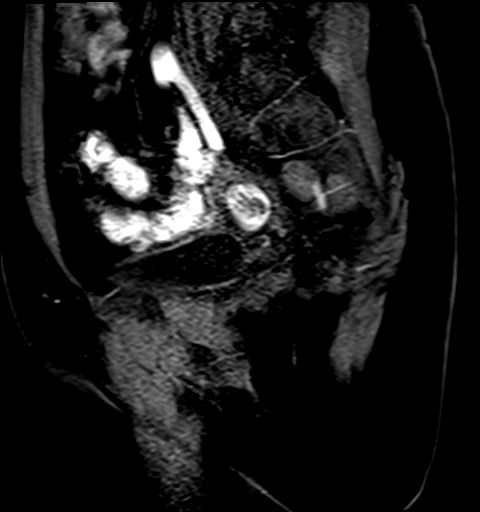
[im 29/44]
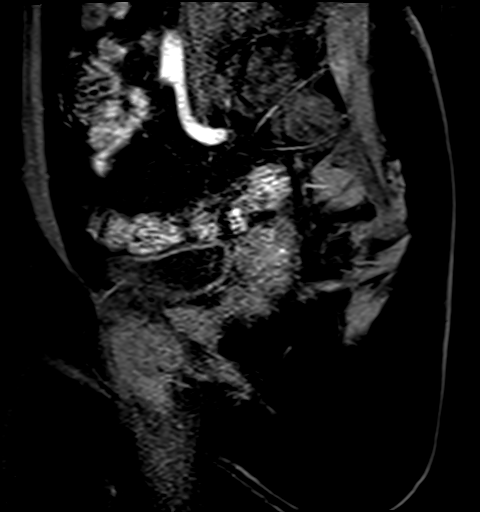
[im 44/44]
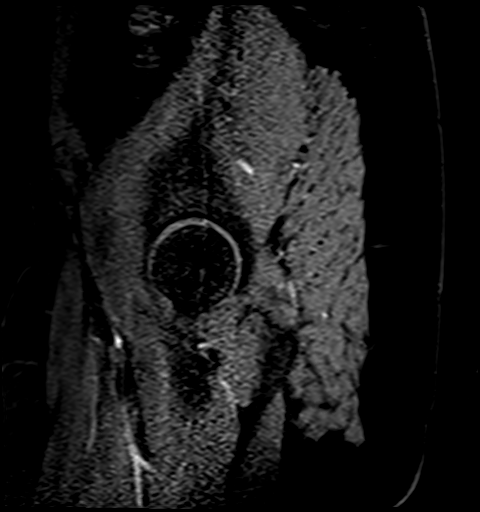

[Series 10: T1 dynamic post-contrast · sagittal · 4.0mm · 0.49mm/px · 3 of 44 slices shown (2 of 2)]
[im 1/44]
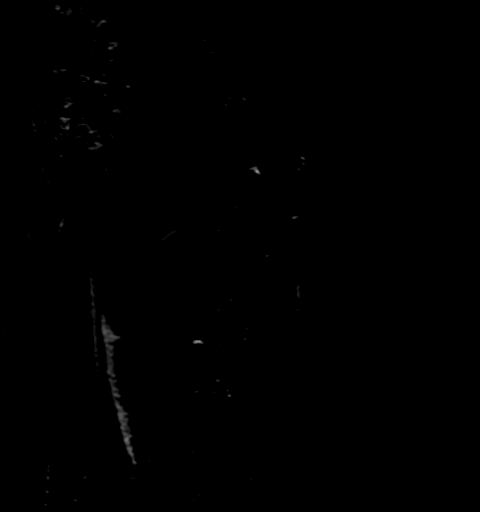
[im 15/44]
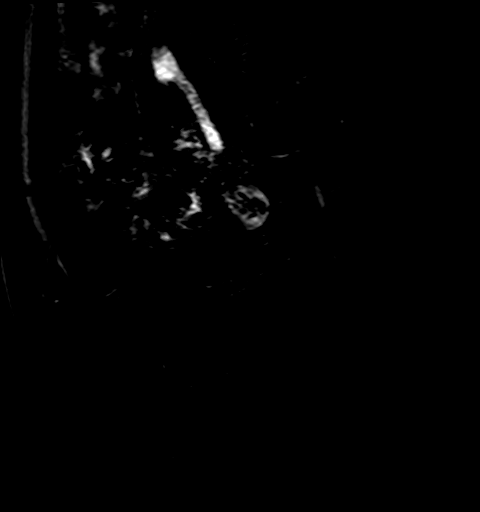
[im 29/44]
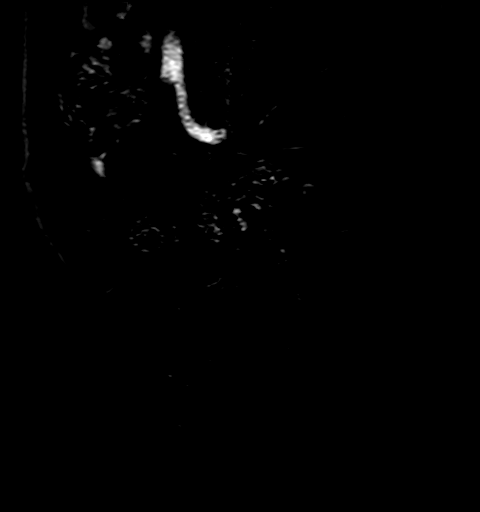

[23 of 48 positions shown; findings below may reference images not displayed]

FINDINGS: Uterus:  Postop changes from supracervical hysterectomy are seen.

Endometrium:  Surgically absent.

Cervix: Appears normal with multiple tiny cervical nabothian cyst
noted.

Right ovary: Appears normal, with small peripherally enhancing
corpus luteum cyst noted. No adnexal mass identified.

Left ovary:  Appears normal.  No adnexal mass identified.

Urinary Bladder:  Unremarkable.

Bowel: Visualized pelvic loops are unremarkable.

Other: In the pelvic cul-de-sac, between the cervix and rectum,
there is an ill-defined mass which shows relative T2 hypointensity,
tiny internal cystic foci and diffuse contrast enhancement. This
measures approximately 4.5 x 3.6 x 3.3 cm. This has characteristics
consistent with endometriosis, with other pelvic neoplasms
considered less likely. No evidence of free fluid.
IMPRESSION: 4.5 cm ill-defined soft tissue mass in the pelvic cul-de-sac between
the cervix and rectum, which has characteristics consistent with
endometriosis. Other pelvic neoplasm including metastatic disease
are considered less likely but cannot definitely be excluded.

Normal appearance of both ovaries. No other pelvic masses or
lymphadenopathy identified.

## 2018-12-01 ENCOUNTER — Other Ambulatory Visit: Payer: Self-pay | Admitting: Obstetrics and Gynecology

## 2018-12-01 DIAGNOSIS — M79629 Pain in unspecified upper arm: Secondary | ICD-10-CM

## 2018-12-03 ENCOUNTER — Ambulatory Visit
Admission: RE | Admit: 2018-12-03 | Discharge: 2018-12-03 | Disposition: A | Discharge status: Home / Self Care | Payer: 59 | Source: Ambulatory Visit | Attending: Obstetrics and Gynecology | Admitting: Obstetrics and Gynecology

## 2018-12-03 ENCOUNTER — Other Ambulatory Visit: Payer: Self-pay | Admitting: Obstetrics and Gynecology

## 2018-12-03 ENCOUNTER — Other Ambulatory Visit: Payer: Self-pay

## 2018-12-03 DIAGNOSIS — N6310 Unspecified lump in the right breast, unspecified quadrant: Secondary | ICD-10-CM

## 2018-12-03 DIAGNOSIS — M79629 Pain in unspecified upper arm: Secondary | ICD-10-CM

## 2019-06-03 ENCOUNTER — Other Ambulatory Visit: Payer: Self-pay

## 2019-06-03 ENCOUNTER — Other Ambulatory Visit: Payer: Self-pay | Admitting: Obstetrics and Gynecology

## 2019-06-03 ENCOUNTER — Ambulatory Visit
Admission: RE | Admit: 2019-06-03 | Discharge: 2019-06-03 | Disposition: A | Discharge status: Home / Self Care | Payer: 59 | Source: Ambulatory Visit | Attending: Obstetrics and Gynecology | Admitting: Obstetrics and Gynecology

## 2019-06-03 DIAGNOSIS — N6310 Unspecified lump in the right breast, unspecified quadrant: Secondary | ICD-10-CM

## 2019-09-09 HISTORY — PX: REDUCTION MAMMAPLASTY: SUR839

## 2019-12-04 ENCOUNTER — Other Ambulatory Visit: Payer: 59

## 2020-02-04 ENCOUNTER — Other Ambulatory Visit: Payer: Self-pay | Admitting: Obstetrics and Gynecology

## 2020-02-04 ENCOUNTER — Other Ambulatory Visit: Payer: Self-pay

## 2020-02-04 ENCOUNTER — Ambulatory Visit
Admission: RE | Admit: 2020-02-04 | Discharge: 2020-02-04 | Disposition: A | Discharge status: Home / Self Care | Payer: 59 | Source: Ambulatory Visit | Attending: Obstetrics and Gynecology | Admitting: Obstetrics and Gynecology

## 2020-02-04 DIAGNOSIS — N6310 Unspecified lump in the right breast, unspecified quadrant: Secondary | ICD-10-CM

## 2020-02-11 ENCOUNTER — Ambulatory Visit
Admission: RE | Admit: 2020-02-11 | Discharge: 2020-02-11 | Disposition: A | Discharge status: Home / Self Care | Payer: 59 | Source: Ambulatory Visit | Attending: Obstetrics and Gynecology | Admitting: Obstetrics and Gynecology

## 2020-02-11 ENCOUNTER — Other Ambulatory Visit: Payer: Self-pay

## 2020-02-11 DIAGNOSIS — N6310 Unspecified lump in the right breast, unspecified quadrant: Secondary | ICD-10-CM

## 2023-09-24 ENCOUNTER — Other Ambulatory Visit: Payer: Self-pay | Admitting: Obstetrics and Gynecology

## 2023-09-24 DIAGNOSIS — R921 Mammographic calcification found on diagnostic imaging of breast: Secondary | ICD-10-CM

## 2023-10-30 ENCOUNTER — Ambulatory Visit
Admission: RE | Admit: 2023-10-30 | Discharge: 2023-10-30 | Disposition: A | Discharge status: Home / Self Care | Source: Ambulatory Visit | Attending: Obstetrics and Gynecology | Admitting: Obstetrics and Gynecology

## 2023-10-30 ENCOUNTER — Other Ambulatory Visit: Payer: Self-pay | Admitting: Obstetrics and Gynecology

## 2023-10-30 DIAGNOSIS — N6310 Unspecified lump in the right breast, unspecified quadrant: Secondary | ICD-10-CM

## 2023-10-30 DIAGNOSIS — R921 Mammographic calcification found on diagnostic imaging of breast: Secondary | ICD-10-CM

## 2023-10-31 ENCOUNTER — Ambulatory Visit
Admission: RE | Admit: 2023-10-31 | Discharge: 2023-10-31 | Disposition: A | Discharge status: Home / Self Care | Source: Ambulatory Visit | Attending: Obstetrics and Gynecology | Admitting: Obstetrics and Gynecology

## 2023-10-31 DIAGNOSIS — N6310 Unspecified lump in the right breast, unspecified quadrant: Secondary | ICD-10-CM

## 2023-10-31 HISTORY — PX: BREAST BIOPSY: SHX20

## 2023-11-01 LAB — SURGICAL PATHOLOGY
